# Patient Record
Sex: Male | Born: 1950 | Race: Black or African American | Hispanic: No | Marital: Married | Smoking: Never smoker
Health system: Southern US, Community
[De-identification: ages and names within clinical notes are randomized; demographics above are authoritative.]

## PROBLEM LIST (undated history)

## (undated) DIAGNOSIS — Z9889 Other specified postprocedural states: Secondary | ICD-10-CM

## (undated) DIAGNOSIS — Z9289 Personal history of other medical treatment: Secondary | ICD-10-CM

## (undated) DIAGNOSIS — I10 Essential (primary) hypertension: Secondary | ICD-10-CM

## (undated) DIAGNOSIS — I422 Other hypertrophic cardiomyopathy: Secondary | ICD-10-CM

## (undated) HISTORY — DX: Other hypertrophic cardiomyopathy: I42.2

## (undated) HISTORY — DX: Personal history of other medical treatment: Z92.89

## (undated) HISTORY — PX: CORONARY ANGIOPLASTY: SHX604

## (undated) HISTORY — DX: Other specified postprocedural states: Z98.890

---

## 2001-09-04 HISTORY — PX: LIPOMA EXCISION: SHX5283

## 2003-12-18 ENCOUNTER — Emergency Department (HOSPITAL_COMMUNITY): Admission: EM | Admit: 2003-12-18 | Discharge: 2003-12-18 | Payer: Self-pay | Admitting: Emergency Medicine

## 2004-08-23 ENCOUNTER — Ambulatory Visit (HOSPITAL_BASED_OUTPATIENT_CLINIC_OR_DEPARTMENT_OTHER): Admission: RE | Admit: 2004-08-23 | Discharge: 2004-08-23 | Payer: Self-pay | Admitting: *Deleted

## 2011-11-29 ENCOUNTER — Ambulatory Visit: Payer: Self-pay | Admitting: Family Medicine

## 2011-11-29 ENCOUNTER — Ambulatory Visit: Payer: Self-pay

## 2011-11-29 VITALS — BP 182/108 | HR 71 | Temp 98.5°F | Resp 16 | Ht 66.0 in | Wt 166.6 lb

## 2011-11-29 DIAGNOSIS — M545 Low back pain, unspecified: Secondary | ICD-10-CM

## 2011-11-29 LAB — POCT URINALYSIS DIPSTICK
Bilirubin, UA: NEGATIVE
Blood, UA: NEGATIVE
Glucose, UA: NEGATIVE
Leukocytes, UA: NEGATIVE
Nitrite, UA: NEGATIVE
Protein, UA: 100
Spec Grav, UA: 1.02
Urobilinogen, UA: 1
pH, UA: 6.5

## 2011-11-29 LAB — POCT UA - MICROSCOPIC ONLY
Casts, Ur, LPF, POC: NEGATIVE
Crystals, Ur, HPF, POC: NEGATIVE
Mucus, UA: POSITIVE
Yeast, UA: NEGATIVE

## 2011-11-29 MED ORDER — METHYLPREDNISOLONE 4 MG PO TABS: ORAL_TABLET | ORAL | Status: DC

## 2011-11-29 NOTE — Progress Notes (Signed)
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  No h/o back problems.  No fever or bowel or bladder problems.  Legs are strong without numbness or radicular sx.  Pain subsides when lying down.  O:  NAD SLR  Normal No muscle asymmetry. Reflexes intact. Moving slowly but without antalgic gain Nontender lumbar area [facility] reading (PRIMARY) by  Dr. Milus Glazier lumbar AP/Lat view:  Neg U/A:  neg.   A:  Muscular strain  P:  Medrol 4 mg ii daily x 5

## 2011-11-30 ENCOUNTER — Encounter: Payer: Self-pay | Admitting: Family Medicine

## 2011-11-30 ENCOUNTER — Telehealth: Payer: Self-pay

## 2011-11-30 NOTE — Telephone Encounter (Signed)
Asked Darl Pikes for more details about what pt needed. She reported that the pt came in and got the work note he needed for today. No need to CB

## 2011-11-30 NOTE — Telephone Encounter (Signed)
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

## 2011-12-08 ENCOUNTER — Other Ambulatory Visit: Payer: Self-pay

## 2011-12-08 ENCOUNTER — Emergency Department (HOSPITAL_COMMUNITY): Payer: Self-pay

## 2011-12-08 ENCOUNTER — Inpatient Hospital Stay (HOSPITAL_COMMUNITY)
Admission: EM | Admit: 2011-12-08 | Discharge: 2011-12-10 | DRG: 287 | Disposition: A | Discharge status: Home / Self Care | Payer: Self-pay | Attending: Interventional Cardiology | Admitting: Interventional Cardiology

## 2011-12-08 ENCOUNTER — Encounter: Payer: Self-pay | Admitting: Physician Assistant

## 2011-12-08 ENCOUNTER — Inpatient Hospital Stay (HOSPITAL_COMMUNITY): Payer: Self-pay

## 2011-12-08 ENCOUNTER — Encounter (HOSPITAL_COMMUNITY)
Admission: EM | Disposition: A | Discharge status: Home / Self Care | Payer: Self-pay | Source: Home / Self Care | Attending: Interventional Cardiology

## 2011-12-08 ENCOUNTER — Encounter (HOSPITAL_COMMUNITY): Payer: Self-pay | Admitting: *Deleted

## 2011-12-08 ENCOUNTER — Ambulatory Visit (INDEPENDENT_AMBULATORY_CARE_PROVIDER_SITE_OTHER): Payer: Self-pay | Admitting: Family Medicine

## 2011-12-08 VITALS — BP 185/107 | HR 61 | Temp 98.0°F | Resp 16 | Ht 65.5 in | Wt 180.6 lb

## 2011-12-08 DIAGNOSIS — I10 Essential (primary) hypertension: Secondary | ICD-10-CM | POA: Diagnosis present

## 2011-12-08 DIAGNOSIS — R9431 Abnormal electrocardiogram [ECG] [EKG]: Secondary | ICD-10-CM

## 2011-12-08 DIAGNOSIS — I517 Cardiomegaly: Principal | ICD-10-CM | POA: Diagnosis present

## 2011-12-08 HISTORY — DX: Essential (primary) hypertension: I10

## 2011-12-08 HISTORY — PX: LEFT HEART CATHETERIZATION WITH CORONARY ANGIOGRAM: SHX5451

## 2011-12-08 LAB — COMPREHENSIVE METABOLIC PANEL
ALT: 19 U/L (ref 0–53)
Alkaline Phosphatase: 72 U/L (ref 39–117)
Alkaline Phosphatase: 72 U/L (ref 39–117)
BUN: 11 mg/dL (ref 6–23)
CO2: 27 mEq/L (ref 19–32)
Creatinine, Ser: 0.81 mg/dL (ref 0.50–1.35)
Glucose, Bld: 129 mg/dL — ABNORMAL HIGH (ref 70–99)
Potassium: 4.3 mEq/L (ref 3.5–5.3)
Sodium: 140 mEq/L (ref 135–145)
Total Bilirubin: 0.5 mg/dL (ref 0.3–1.2)
Total Protein: 7.1 g/dL (ref 6.0–8.3)

## 2011-12-08 LAB — APTT: aPTT: 33 seconds (ref 24–37)

## 2011-12-08 LAB — LIPID PANEL
Cholesterol: 178 mg/dL (ref 0–200)
HDL: 50 mg/dL (ref >39)
HDL: 47 mg/dL (ref >39)
LDL Cholesterol: 119 mg/dL — ABNORMAL HIGH (ref 0–99)
Total CHOL/HDL Ratio: 3.6 Ratio
Triglycerides: 45 mg/dL (ref <150)
Triglycerides: 43 mg/dL (ref <150)
VLDL: 9 mg/dL (ref 0–40)

## 2011-12-08 LAB — CBC
MCH: 30.4 pg (ref 26.0–34.0)
MCV: 85.1 fL (ref 78.0–100.0)
WBC: 9.7 10*3/uL (ref 4.0–10.5)

## 2011-12-08 LAB — PROTIME-INR
INR: 1.08 (ref 0.00–1.49)
Prothrombin Time: 15.1 seconds (ref 11.6–15.2)

## 2011-12-08 LAB — HEMOGLOBIN A1C
Hgb A1c MFr Bld: 7 % — ABNORMAL HIGH (ref <5.7)
Mean Plasma Glucose: 154 mg/dL — ABNORMAL HIGH (ref <117)

## 2011-12-08 LAB — MRSA PCR SCREENING: MRSA by PCR: NEGATIVE

## 2011-12-08 SURGERY — LEFT HEART CATHETERIZATION WITH CORONARY ANGIOGRAM
Anesthesia: LOCAL

## 2011-12-08 MED ORDER — LIDOCAINE HCL (PF) 1 % IJ SOLN
INTRAMUSCULAR | Status: AC
  Filled 2011-12-08: qty 30

## 2011-12-08 MED ORDER — NITROGLYCERIN 0.4 MG SL SUBL: 0.4000 mg | SUBLINGUAL_TABLET | SUBLINGUAL | Status: DC | PRN

## 2011-12-08 MED ORDER — MORPHINE SULFATE 2 MG/ML IJ SOLN: 1.0000 mg | INTRAMUSCULAR | Status: DC | PRN

## 2011-12-08 MED ORDER — ASPIRIN 81 MG PO CHEW
324.0000 mg | CHEWABLE_TABLET | Freq: Once | ORAL | Status: AC
  Administered 2011-12-08: 324 mg via ORAL

## 2011-12-08 MED ORDER — ACETAMINOPHEN 325 MG PO TABS
650.0000 mg | ORAL_TABLET | ORAL | Status: DC | PRN
  Administered 2011-12-08 – 2011-12-09 (×2): 325 mg via ORAL
  Filled 2011-12-08: qty 1
  Filled 2011-12-08: qty 2

## 2011-12-08 MED ORDER — ASPIRIN 81 MG PO CHEW
324.0000 mg | CHEWABLE_TABLET | Freq: Once | ORAL | Status: DC
  Filled 2011-12-08: qty 1

## 2011-12-08 MED ORDER — MIDAZOLAM HCL 2 MG/2ML IJ SOLN
INTRAMUSCULAR | Status: AC
  Filled 2011-12-08: qty 2

## 2011-12-08 MED ORDER — FENTANYL CITRATE 0.05 MG/ML IJ SOLN
INTRAMUSCULAR | Status: AC
  Filled 2011-12-08: qty 2

## 2011-12-08 MED ORDER — SODIUM CHLORIDE 0.9 % IV SOLN: 1.0000 mL/kg/h | INTRAVENOUS | Status: AC

## 2011-12-08 MED ORDER — NITROGLYCERIN 0.2 MG/ML ON CALL CATH LAB
INTRAVENOUS | Status: AC
  Filled 2011-12-08: qty 1

## 2011-12-08 MED ORDER — HEPARIN BOLUS VIA INFUSION: 4000.0000 [IU] | Freq: Once | INTRAVENOUS | Status: DC

## 2011-12-08 MED ORDER — NITROGLYCERIN IN D5W 200-5 MCG/ML-% IV SOLN
INTRAVENOUS | Status: AC
  Filled 2011-12-08: qty 250

## 2011-12-08 MED ORDER — HEPARIN (PORCINE) IN NACL 100-0.45 UNIT/ML-% IJ SOLN: 1000.0000 [IU]/h | INTRAMUSCULAR | Status: DC

## 2011-12-08 MED ORDER — ASPIRIN 81 MG PO CHEW
81.0000 mg | CHEWABLE_TABLET | Freq: Every day | ORAL | Status: DC
  Administered 2011-12-09 – 2011-12-10 (×2): 81 mg via ORAL
  Filled 2011-12-08: qty 1

## 2011-12-08 MED ORDER — LISINOPRIL 10 MG PO TABS
10.0000 mg | ORAL_TABLET | Freq: Every day | ORAL | Status: DC
  Administered 2011-12-08 – 2011-12-09 (×2): 10 mg via ORAL
  Filled 2011-12-08 (×2): qty 1

## 2011-12-08 MED ORDER — ONDANSETRON HCL 4 MG/2ML IJ SOLN: 4.0000 mg | Freq: Four times a day (QID) | INTRAMUSCULAR | Status: DC | PRN

## 2011-12-08 MED ORDER — HEPARIN (PORCINE) IN NACL 2-0.9 UNIT/ML-% IJ SOLN
INTRAMUSCULAR | Status: AC
  Filled 2011-12-08: qty 2000

## 2011-12-08 MED ORDER — SODIUM CHLORIDE 0.9 % IV SOLN: 1000.0000 mL | INTRAVENOUS | Status: DC

## 2011-12-08 MED ORDER — HEPARIN SODIUM (PORCINE) 5000 UNIT/ML IJ SOLN
INTRAMUSCULAR | Status: AC
  Filled 2011-12-08: qty 1

## 2011-12-08 MED ORDER — SODIUM CHLORIDE 0.9 % IV SOLN: INTRAVENOUS | Status: DC

## 2011-12-08 MED ORDER — NITROGLYCERIN IN D5W 200-5 MCG/ML-% IV SOLN: 5.0000 ug/min | INTRAVENOUS | Status: DC

## 2011-12-08 MED ORDER — ASPIRIN 81 MG PO CHEW: 324.0000 mg | CHEWABLE_TABLET | Freq: Once | ORAL | Status: DC

## 2011-12-08 NOTE — ED Notes (Signed)
Pt. Undress.in a gown On monitor

## 2011-12-08 NOTE — Progress Notes (Signed)
Chaplain Note:  Chaplain responded to code STEMI page received at 09:28.  Chaplain provided spiritual comfort, support, and prayer for pt and pt's family.  Chaplain supported staff by serving as liaison between [employer] and family.  Pt's family expressed appreciation for chaplain support.  Chaplain will follow up if needed.   12/08/11 1100  Clinical Encounter Type  Visited With Patient;Family;Health care provider  Visit Type Spiritual support;ED;Other (Comment) (Code STEMI)  Referral From Other (Comment) (Code pager)  Spiritual Encounters  Spiritual Needs Emotional;Prayer  Stress Factors  Patient Stress Factors Major life changes;Health changes  Family Stress Factors Major life changes    Verdie Shire, chaplain resident [phone]

## 2011-12-08 NOTE — ED Notes (Signed)
MD at bedside. 

## 2011-12-08 NOTE — Progress Notes (Signed)
  Subjective:    Patient ID: Sean Pittman, male    DOB: Oct 03, 1950, 61 y.o.   MRN: [id number]  HPI  Pt presents to clinic for recheck BP.  He was seen last week for back pain which has significantly improved but at that visit he was told his BP was high.  He has been monitoring it all week and has had readings in 200's/100's.  He feels fine, no CP, no nausea or vision changes.  Nonsmoker.  He has had problems with his BP being high in the past but got it controlled with diet but his diet has returned to poor.  He gets heartburn only after his wife's meatloaf.  He is here today because he thinks he needs to start medications for control.  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  Review of Systems  Eyes: Negative for visual disturbance.  Respiratory: Negative for shortness of breath.   Cardiovascular: Negative for chest pain.  Gastrointestinal: Negative for nausea.       Objective:   Physical Exam  Constitutional: He is oriented to person, place, and time. He appears well-developed and well-nourished.  HENT:  Head: Normocephalic and atraumatic.  Right Ear: External ear normal.  Left Ear: External ear normal.  Nose: Nose normal.  Eyes: Conjunctivae are normal. Pupils are equal, round, and reactive to light.  Neck: Neck supple.  Cardiovascular: Normal rate, regular rhythm and normal heart sounds.   No murmur heard. Pulmonary/Chest: Effort normal and breath sounds normal.  Neurological: He is alert and oriented to person, place, and time.  Skin: Skin is warm and dry.  Psychiatric: He has a normal mood and affect. His behavior is normal. Judgment and thought content normal.      EKG - T wave inversion in anteriolateral leads, some ST elevation in V2, V3. - see scanned version.    Assessment & Plan:   1. HTN (hypertension)  Comprehensive metabolic panel, Lipid panel, EKG 12-Lead, aspirin chewable tablet 324 mg  2. Abnormal EKG     Due to significantly elevated BP and abnl  EKG pt to [facility] for evaluation.  Pt started on O2, given ASA and IV started.    Pt to [facility] by EMS.  Pt to f/u for continued HTN treatment.  D/w Dr. Milus Glazier.

## 2011-12-08 NOTE — H&P (Signed)
Admit date: 12/08/2011 Referring Physician Dr. Ignacia Palma Primary CardiologistVaranasi Chief complaint/reason for admission: ECG changes, back pain  HPI: 61 y/o who does not see a doctor regularly.  He went to urgent care for back pain.  He had an ECG done there and this was abnormal, so he was sent to [facility] for further evaluation.  His back pain is gone.  No chest pain , but he does have anterior ST elevation with lateral ST depression and T wave inversion.  Code STEMI was called.    PMH:   HTN in the past  PSH:  Lipoma  ALLERGIES:   Penicillins  Prior to Admit Meds:   Prescriptions prior to admission  Medication Sig Dispense Refill  . ibuprofen (ADVIL,MOTRIN) 200 MG tablet Take 200 mg by mouth every 6 (six) hours as needed. For allergies       Family HX:    Family History  Problem Relation Age of Onset  . Hypertension Mother   . Diabetes Mother   . Hyperlipidemia Mother   . Kidney disease Mother   . Cancer Father   . Hypertension Sister    Social HX:    History   Social History  . Marital Status: Married    Spouse Name: N/A    Number of Children: N/A  . Years of Education: N/A   Occupational History  . Not on file.   Social History Main Topics  . Smoking status: Never Smoker   . Smokeless tobacco: Not on file  . Alcohol Use: No  . Drug Use: No  . Sexually Active: Not on file   Other Topics Concern  . Not on file   Social History Narrative  . No narrative on file     ROS:  All 11 ROS were addressed and are negative except what is stated in the HPI  PHYSICAL EXAM Filed Vitals:   12/08/11 0918  BP: 178/100  Pulse: 58  Temp: 97.7 F (36.5 C)  Resp: 16   General: Well developed, well nourished, in no acute distress Head: Eyes PERRLA, No xanthomas.   Normal cephalic and atramatic  Lungs:   Clear bilaterally to auscultation and percussion. Heart:   HRRR S1 S2 Pulses are 2+ & equal.            No carotid bruit. No JVD.  Abdomen: abdomen soft and  non-tender Msk:  Back normal, normal gait. Normal strength and tone for age. Extremities:   No clubbing, cyanosis or edema. Neuro: Alert and oriented X 3. Psych:  Good affect, responds appropriately   Labs:   Lab Results  Component Value Date   WBC 9.7 12/08/2011   HGB 15.1 12/08/2011   HCT 42.2 12/08/2011   MCV 85.1 12/08/2011   PLT 209 12/08/2011   No results found for this basename: NA,K,CL,CO2,BUN,CREATININE,CALCIUM,LABALBU,PROT,BILITOT,ALKPHOS,ALT,AST,GLUCOSE in the last 168 hours No results found for this basename: CKTOTAL, CKMB, CKMBINDEX, TROPONINI   No results found for this basename: PTT   No results found for this basename: INR, PROTIME     No results found for this basename: CHOL   No results found for this basename: HDL   No results found for this basename: LDLCALC   No results found for this basename: TRIG   No results found for this basename: CHOLHDL   No results found for this basename: LDLDIRECT      Radiology:  @RISRSLT24 @  EKG:  NSR, anterior ST elevaton; lateral T wave inversion  ASSESSMENT: Abnormal ECG, possible anterior  MI  PLAN:  ECG is clearly abnormal.  Back pain better.  I suspect this is from LVH, particularly in the apex.  Discussed cath with patient.  WIll proceed with emergent cath. Will need BP meds.  Further plan depend on cath results.  Corky Crafts., MD  12/08/2011  10:20 AM

## 2011-12-08 NOTE — ED Notes (Signed)
Per EMS pt from Urgent Care with c/o EKG changes. Pt went to UC to have BP checked, noticed to hypertensive, performed EKG and noticed ST elevation. Not enough to call Code Stemi. Pt denies chest pain, shortness of breath, nausea/vomiting, diaphoresis or lightheadedness. Pt alert and oriented. VS BP 186/104 HR 60. Pt received baby aspirin x 4. IV 20G LAC.

## 2011-12-08 NOTE — ED Provider Notes (Signed)
History     CSN: [id number]  Arrival date & time 12/08/11  0915   None     Chief Complaint  Patient presents with  . Abnormal ECG    (Consider location/radiation/quality/duration/timing/severity/associated sxs/prior treatment) HPI Comments: Pt is a 61 year old man who had hurt his back last week. He had been seen at an urgent medical center and given medicine.  He had his blood pressure checked at church yesterday and it was high, so he went back to the urgent care center, had an EKG that showed ST elevation in the precordial leads, and was sent to [facility] ED for evaluation by EMS.  He denies chest pain, shortness of breath, sweating.  Patient is a 61 y.o. male presenting with chest pain. The history is provided by the patient and medical records ([facility]). No language interpreter was used.  Chest Pain     History reviewed. No pertinent past medical history.  History reviewed. No pertinent past surgical history.  Family History  Problem Relation Age of Onset  . Hypertension Mother   . Diabetes Mother   . Hyperlipidemia Mother   . Kidney disease Mother   . Cancer Father   . Hypertension Sister     History  Substance Use Topics  . Smoking status: Never Smoker   . Smokeless tobacco: Not on file  . Alcohol Use: No      Review of Systems  Constitutional: Negative.   HENT: Negative.   Eyes: Negative.   Respiratory: Negative.   Cardiovascular: Negative for chest pain.       Abnormal EKG noted at urgent care center.  Gastrointestinal: Negative.   Genitourinary: Negative.   Musculoskeletal: Positive for back pain.  Skin: Negative.   Neurological: Negative.   Psychiatric/Behavioral: Negative.     Allergies  Penicillins  Home Medications   Current Outpatient Rx  Name Route Sig Dispense Refill  . IBUPROFEN 200 MG PO TABS Oral Take 200 mg by mouth every 6 (six) hours as needed. For allergies      BP 178/100  Pulse 58  Temp 97.7 F (36.5  C)  Resp 16  Ht 5\' 6"  (1.676 m)  Wt 175 lb (79.379 kg)  BMI 28.25 kg/m2  SpO2 100%  Physical Exam  Nursing note and vitals reviewed. Constitutional: He is oriented to person, place, and time.       Hypertensive, no distress at rest.  HENT:  Head: Normocephalic and atraumatic.  Right Ear: External ear normal.  Left Ear: External ear normal.  Mouth/Throat: Oropharynx is clear and moist.  Eyes: Conjunctivae and EOM are normal. Pupils are equal, round, and reactive to light.  Neck: Normal range of motion. Neck supple.  Cardiovascular: Normal rate, regular rhythm and normal heart sounds.   Pulmonary/Chest: Effort normal and breath sounds normal.  Abdominal: Soft. Bowel sounds are normal.  Musculoskeletal: Normal range of motion. He exhibits no edema and no tenderness.  Neurological: He is alert and oriented to person, place, and time.       No sensory or motor deficits.  Skin: Skin is warm and dry.  Psychiatric: He has a normal mood and affect. His behavior is normal.    ED Course  Procedures (including critical care time)   Labs Reviewed  CBC  COMPREHENSIVE METABOLIC PANEL  PROTIME-INR  APTT  URINALYSIS, ROUTINE W REFLEX MICROSCOPIC   9:41 AM  Date: 12/08/2011  Rate:60  Rhythm: normal sinus rhythm  QRS Axis: normal  Intervals:  normal  ST/T Wave abnormalities: ST elevations anteriorly, inverted T waves in I, II, AVL, and V3-V6.  Conduction Disutrbances:none  Narrative Interpretation: Acute anterior myocardial infarction.  Old EKG Reviewed: none available  CODE STEMI called, Dr. Eldridge Dace responded and took pt to the cath lab.     1. Acute myocardial infarction           Carleene Cooper III, MD 12/08/11 [phone]

## 2011-12-08 NOTE — CV Procedure (Signed)
PROCEDURE:  Left heart catheterization with selective coronary angiography, left ventriculogram.  INDICATIONS:  Anterior STEMI  The risks, benefits, and details of the procedure were explained to the patient.  The patient verbalized understanding and wanted to proceed.  Informed written consent was obtained.  PROCEDURE TECHNIQUE:  After Xylocaine anesthesia a 93F sheath was placed in the right femoral artery with a single anterior needle wall stick.   Left coronary angiography was done using a Judkins L4 guide catheter.  Right coronary angiography was done using a Judkins R4 guide catheter.  Left ventriculography was done using a pigtail catheter.    CONTRAST:  Total of 80 cc.  COMPLICATIONS:  None.    HEMODYNAMICS:  Aortic pressure was 167/102; LV pressure was 170/21; LVEDP 30.  There was no gradient between the left ventricle and aorta.    ANGIOGRAPHIC DATA:   The left main coronary artery is widely patent.  The left anterior descending artery is a large vessel that reaches the apex.  There is minimal atherosclerosis.  Two diagonals which are patent.  The left circumflex artery is a large vessel.  OM1 is large and angiographically normal.  The right coronary artery is a large. PDA and PLA are medium sized and patent.  LEFT VENTRICULOGRAM:  Left ventricular angiogram was done in the 30 RAO projection and revealed normal left ventricular wall motion and systolic function with an estimated ejection fraction of 60%.  LVEDP was 30 mmHg.  Evidence of apical hypertrophy.  IMPRESSIONS:  1. Normal left main coronary artery. 2. No significant atheropsclerosis in the left anterior descending artery and its branches. 3. Normal left circumflex artery and its branches. 4. Normal right coronary artery. 5. Normal left ventricular systolic function.  LVEDP 30 mmHg.  Ejection fraction 60%.  RECOMMENDATION:  Apical hypertrophy is cause of ECG changes.  Needs aggressive BP control.  Will initiate  ACE-I.

## 2011-12-09 LAB — URINALYSIS, ROUTINE W REFLEX MICROSCOPIC
Leukocytes, UA: NEGATIVE
Protein, ur: NEGATIVE mg/dL
Specific Gravity, Urine: 1.017 (ref 1.005–1.030)
Urobilinogen, UA: 1 mg/dL (ref 0.0–1.0)
pH: 5.5 (ref 5.0–8.0)

## 2011-12-09 LAB — CARDIAC PANEL(CRET KIN+CKTOT+MB+TROPI)
CK, MB: 1.9 ng/mL (ref 0.3–4.0)
Relative Index: 1.5 (ref 0.0–2.5)
Total CK: 123 U/L (ref 7–232)
Troponin I: <0.3 ng/mL (ref <0.30)

## 2011-12-09 LAB — BASIC METABOLIC PANEL
CO2: 25 mEq/L (ref 19–32)
Chloride: 104 mEq/L (ref 96–112)
Creatinine, Ser: 1.04 mg/dL (ref 0.50–1.35)
Potassium: 4.4 mEq/L (ref 3.5–5.1)

## 2011-12-09 MED ORDER — LISINOPRIL 10 MG PO TABS
10.0000 mg | ORAL_TABLET | Freq: Once | ORAL | Status: AC
  Administered 2011-12-09: 10 mg via ORAL
  Filled 2011-12-09: qty 1

## 2011-12-09 MED ORDER — LISINOPRIL 10 MG PO TABS: 10.0000 mg | ORAL_TABLET | Freq: Every day | ORAL | Status: DC

## 2011-12-09 MED ORDER — ASPIRIN 81 MG PO CHEW: 81.0000 mg | CHEWABLE_TABLET | Freq: Every day | ORAL | Status: AC

## 2011-12-09 MED ORDER — SODIUM CHLORIDE 0.9 % IJ SOLN
3.0000 mL | Freq: Two times a day (BID) | INTRAMUSCULAR | Status: DC
  Administered 2011-12-10: 3 mL via INTRAVENOUS

## 2011-12-09 MED ORDER — LISINOPRIL 20 MG PO TABS
20.0000 mg | ORAL_TABLET | Freq: Every day | ORAL | Status: DC
  Administered 2011-12-10: 20 mg via ORAL
  Filled 2011-12-09: qty 1

## 2011-12-09 NOTE — Discharge Summary (Addendum)
Patient ID: Sean Pittman MRN: [id number] DOB/AGE: Dec 25, 1950 61 y.o.  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  He went to emergent cath and was found to have normal coronary arteries with normal LVF and apical hypertrophy which was felt to be the cause of his abnormal EKG.  He did well post-op and Lisinopril was added for BP control.  His BP was improved post-cath but still elevated and Lisinopril was increased to 20mg  daily and Coreg 3.125mg  BID added with improvement in BP.  He was discharge to home in stable condition.   Discharge Exam: Blood pressure 178/88, pulse 61, temperature 98.5 F (36.9 C), temperature source Oral, resp. rate 16, height 5\' 6"  (1.676 m), weight 80.7 kg (177 lb 14.6 oz), SpO2 97.00%.   WD WN BM in NAD Resp: clear to auscultation bilaterally Cor:  RRR no M/R/G Abd:  Soft, NT, ND, active BS Ext:  No cyanosis, erythema or edema, +2 pulses distally Labs:   Lab Results  Component Value Date   WBC 9.7 12/08/2011   HGB 15.1 12/08/2011   HCT 42.2 12/08/2011   MCV 85.1 12/08/2011   PLT 209 12/08/2011    Lab 12/09/11 0500 12/08/11 0932  NA 137 --  K 4.4 --  CL 104 --  CO2 25 --  BUN 14 --  CREATININE 1.04 --  CALCIUM 8.7 --  PROT -- 7.1  BILITOT -- 0.5  ALKPHOS -- 72  ALT -- 19  AST -- 33  GLUCOSE 116* --   Lab Results  Component Value Date   CKTOTAL 123 12/09/2011   CKMB 1.9 12/09/2011   TROPONINI <0.30  12/09/2011    Lab Results  Component Value Date   CHOL 151 12/08/2011   CHOL 178 12/08/2011   Lab Results  Component Value Date   HDL 47 12/08/2011   HDL 50 12/08/2011   Lab Results  Component Value Date   LDLCALC 95 12/08/2011   LDLCALC 119* 12/08/2011   Lab Results  Component Value Date   TRIG 43 12/08/2011   TRIG 45 12/08/2011   Lab Results  Component Value Date   CHOLHDL 3.2 12/08/2011   CHOLHDL 3.6 12/08/2011       Radiology: none  EKG:  NSR with LVH and ST depression with T wave inversions inferolaterally  FOLLOW UP PLANS AND APPOINTMENTS  Medication List  As of 12/09/2011 11:46 AM   ASK your doctor about these medications         ibuprofen 200 MG tablet   Commonly known as: ADVIL,MOTRIN   Take 200 mg by mouth every 6 (six) hours as needed. For allergies                    Aspirin 81mg  tablet             Take 1 tablet daily by mouth               Lisinopril  20mg  tablet              Take 1 tablet daily by mouth                Carvedilol 3.125mg  tablet             Take 1 tablet twice daily by mouth   Call Monday for an appointment to see Dr. Eldridge Dace on 4/9 for followup of blood pressure - [phone]  BRING ALL MEDICATIONS WITH YOU TO FOLLOW UP APPOINTMENTS  Time spent with patient to include physician time:35 minutes Signed: Quintella Reichert

## 2011-12-09 NOTE — Plan of Care (Signed)
Problem: Phase II Progression Outcomes Goal: Wean IV nitroglycerin to PO or topical Outcome: Completed/Met Date Met:  12/09/11 Nitroglycerin drip weaned off

## 2011-12-10 MED ORDER — CARVEDILOL 3.125 MG PO TABS
3.1250 mg | ORAL_TABLET | Freq: Two times a day (BID) | ORAL | Status: DC
  Administered 2011-12-10 (×2): 3.125 mg via ORAL
  Filled 2011-12-10 (×4): qty 1

## 2011-12-10 MED ORDER — CARVEDILOL 3.125 MG PO TABS: 3.1250 mg | ORAL_TABLET | Freq: Two times a day (BID) | ORAL | Status: DC

## 2011-12-10 MED ORDER — LISINOPRIL 20 MG PO TABS: 20.0000 mg | ORAL_TABLET | Freq: Every day | ORAL | Status: DC

## 2011-12-10 NOTE — Progress Notes (Signed)
SUBJECTIVE:  BP still not adequately controlled  OBJECTIVE:   Vitals:   Filed Vitals:  I&O's:   TELEMETRY: Reviewed telemetry pt in NSR     PHYSICAL EXAM General: Well developed, well nourished, in no acute distress Head: Eyes PERRLA, No xanthomas.   Normal cephalic and atramatic  Lungs:   Clear bilaterally to auscultation and percussion. Heart:   HRRR S1 S2 Pulses are 2+ & equal. Abdomen: Bowel sounds are positive, abdomen soft and non-tender without masses  Extremities:   No clubbing, cyanosis or edema.  DP +1 Neuro: Alert and oriented X 3. Psych:  Good affect, responds appropriately   LABS: Basic Metabolic Panel:  Basename 12/09/11 0500 12/08/11 0932  NA 137 137  K 4.4 5.2*  CL 104 103  CO2 25 24  GLUCOSE 116* 109*  BUN 14 11  CREATININE 1.04 0.81  CALCIUM 8.7 8.6  MG -- --  PHOS -- --   Liver Function Tests:  Basename 12/08/11 0932 12/08/11 0824  AST 33 15  ALT 19 17  ALKPHOS 72 72  BILITOT 0.5 0.7  PROT 7.1 7.1  ALBUMIN 3.5 4.3   No results found for this basename: LIPASE:2,AMYLASE:2 in the last 72 hours CBC:  Basename 12/08/11 0932  WBC 9.7  NEUTROABS --  HGB 15.1  HCT 42.2  MCV 85.1  PLT 209   Cardiac Enzymes:  Basename 12/09/11 0500 12/08/11 1000  CKTOTAL 123 173  CKMB 1.9 2.6  CKMBINDEX -- --  TROPONINI <0.30 <0.30   BNP: No components found with this basename: POCBNP:3 D-Dimer: No results found for this basename: DDIMER:2 in the last 72 hours Hemoglobin A1C:  Basename 12/08/11 1000  HGBA1C 7.0*   Fasting Lipid Panel:  Basename 12/08/11 1000  CHOL 151  HDL 47  LDLCALC 95  TRIG 43  CHOLHDL 3.2  LDLDIRECT --   Coag Panel:   Lab Results  Component Value Date   INR 1.08 12/08/2011   INR 1.17 12/08/2011    RADIOLOGY: Dg Lumbar Spine 2-3 Views  11/29/2011  *RADIOLOGY REPORT*  Clinical Data: 61 year old male with lumbar back pain.  LUMBAR SPINE - 2-3 VIEW  Comparison: None.  Findings: Normal lumbar segmentation. Bone  mineralization is within normal limits.  No spondylolisthesis.  Straightening of lordosis. Relatively preserved disc spaces.  Mild endplate spurring.  SI joints within normal limits.  IMPRESSION: No acute osseous abnormality in the lumbar spine.  Original Report Authenticated By: Ulla Potash III, M.D.      ASSESSMENT:  1.  Back pain the abnormal EKG 2.  Normal coronary arteries 3.  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 HTN elevated this am  PLAN:   1.  Will wait 1 hour after getting his am Lisinopril and ambulate in hall.  Recheck BP and if elevated then will increase Lisinopril to 20mg  and keep overnight.  Quintella Reichert, MD  12/10/2011  7:07 AM

## 2011-12-10 NOTE — Progress Notes (Signed)
Dr. Mayford Knife aware pt BP in 170s/90s this am.  Gave pt lisinopril prior to d/c as ordered.  Pt bp came down to 166/84.  D/c instructions given.  Pt to follow up with MD on Tuesday.  IV d/c'd  Will d/c home.

## 2013-09-08 ENCOUNTER — Telehealth: Payer: Self-pay | Admitting: *Deleted

## 2013-09-08 MED ORDER — CARVEDILOL 6.25 MG PO TABS: ORAL_TABLET | ORAL | Status: DC

## 2013-09-08 NOTE — Telephone Encounter (Signed)
Pt notified Rx sent in.

## 2013-09-08 NOTE — Telephone Encounter (Signed)
Patient needs refill on coreg to be sent to [facility] due to change in his insurance. He would like you to call him when you have sent it in. Thanks, MI

## 2013-10-14 ENCOUNTER — Telehealth: Payer: Self-pay | Admitting: Interventional Cardiology

## 2013-10-14 MED ORDER — LOSARTAN POTASSIUM 100 MG PO TABS: 100.0000 mg | ORAL_TABLET | Freq: Every day | ORAL | Status: DC

## 2013-10-14 MED ORDER — AMLODIPINE BESYLATE 10 MG PO TABS: 10.0000 mg | ORAL_TABLET | Freq: Every day | ORAL | Status: DC

## 2013-10-14 NOTE — Telephone Encounter (Signed)
Refills sent in. Pt aware.  

## 2013-10-14 NOTE — Telephone Encounter (Signed)
New message      refill--amlodipine 10mg and losartin 100mg .  Pls fax to [facility] [phone].  Pt want a 90day supply.  Pt has not been seen at new office.   pls call pt when it has been faxed.

## 2014-01-07 ENCOUNTER — Encounter: Payer: Self-pay | Admitting: Interventional Cardiology

## 2014-03-03 ENCOUNTER — Ambulatory Visit: Payer: Self-pay | Admitting: Interventional Cardiology

## 2014-03-16 ENCOUNTER — Encounter: Payer: Self-pay | Admitting: Interventional Cardiology

## 2014-03-16 ENCOUNTER — Ambulatory Visit (INDEPENDENT_AMBULATORY_CARE_PROVIDER_SITE_OTHER): Payer: BC Managed Care – PPO | Admitting: Interventional Cardiology

## 2014-03-16 VITALS — BP 171/102 | HR 65 | Ht 66.0 in | Wt 173.0 lb

## 2014-03-16 DIAGNOSIS — R9431 Abnormal electrocardiogram [ECG] [EKG]: Secondary | ICD-10-CM

## 2014-03-16 DIAGNOSIS — I10 Essential (primary) hypertension: Secondary | ICD-10-CM | POA: Insufficient documentation

## 2014-03-16 DIAGNOSIS — R9430 Abnormal result of cardiovascular function study, unspecified: Secondary | ICD-10-CM

## 2014-03-16 NOTE — Progress Notes (Signed)
Patient ID: Sean Pittman Deese, male   DOB: 03/12/1951, 63 y.o.   MRN: [id number]    [address] [place], [place]  [address] Phone: [phone] Fax:  [phone]  Date:  03/16/2014   ID:  Sean Pittman Bille, DOB 02/02/1951, MRN [id number]  PCP:  No primary provider on file.      History of Present Illness: Sean Pittman Sage is a 63 y.o. male who has apical hypertrophy. He has had HTN as well. BP is better controlled. 125-140/70-85 typically at home. No spikes to the 170 systolic since last visit. Hypertension:  Denies : Chest pain.  Dizziness.  Leg edema.  Palpitations.  Syncope.  less walking due to right leg pain. Leg pain has improved.    Wt Readings from Last 3 Encounters:  03/16/14 173 lb (78.472 kg)  12/09/11 177 lb 14.6 oz (80.7 kg)  12/08/11 180 lb 9.6 oz (81.92 kg)     Past Medical History  Diagnosis Date  . Hypertension   . Hyperlipidemia     Current Outpatient Prescriptions  Medication Sig Dispense Refill  . amLODipine (NORVASC) 10 MG tablet Take 1 tablet (10 mg total) by mouth daily.  90 tablet  1  . carvedilol (COREG) 6.25 MG tablet 1 tablet by mouth twice a day  180 tablet  1  . losartan (COZAAR) 100 MG tablet Take 1 tablet (100 mg total) by mouth daily.  90 tablet  1  . metFORMIN (GLUCOPHAGE) 500 MG tablet Take 500 mg by mouth 2 (two) times daily with a meal.        No current facility-administered medications for this visit.    Allergies:    Allergies  Allergen Reactions  . Penicillins     Childhood     Social History:  The patient  reports that he has never smoked. He does not have any smokeless tobacco history on file. He reports that he does not drink alcohol or use illicit drugs.   Family History:  The patient's family history includes Cancer in his father; Diabetes in his mother; Hyperlipidemia in his mother; Hypertension in his mother and sister; Kidney disease in his mother.   ROS:  Please see the history of present illness.  No  nausea, vomiting.  No fevers, chills.  No focal weakness.  No dysuria.   All other systems reviewed and negative. Eating less.  Weight loss.  PHYSICAL EXAM: VS:  BP 171/102  Pulse 65  Ht 5\' 6"  (1.676 m)  Wt 173 lb (78.472 kg)  BMI 27.94 kg/m2 Well nourished, well developed, in no acute distress HEENT: normal Neck: no JVD, no carotid bruits Cardiac:  normal S1, S2; RRR;  Lungs:  clear to auscultation bilaterally, no wheezing, rhonchi or rales Abd: soft, nontender, no hepatomegaly Ext: no edema Skin: warm and dry Neuro:   no focal abnormalities noted  EKG:  NSR, lateral TWI less pronounced from prior     ASSESSMENT AND PLAN:  Hypertension, essential  Continue Losartan Potassium Tablet, 100 MG, 1 tablet, Orally, Once a day, 30 day(s), 30, Refills 11 Continue Norvasc Tablet, 10 Milligram, TAKE 1 TABLET BY MOUTH EVERY DAY, 30 days, 30, Refills 11 Notes: BP better at home. COntinue use generic ARB for cost savings. Cutting salt out as well. Continue to check blood pressure at home.  Call us if systolic (top number) is typically above 140, or below 100. Recheck in the office is 148/76.   2. Nonspecific abnormal unspecified cardiovascular function study  Notes:  Apical hypertrophy noted by cath.    Signed, Fredric Mare, MD, [facility] 03/16/2014 8:24 AM

## 2014-03-16 NOTE — Patient Instructions (Signed)
Your physician recommends that you continue on your current medications as directed. Please refer to the Current Medication list given to you today.  Your physician wants you to follow-up in: 1 year with Dr. Varanasi. You will receive a reminder letter in the mail two months in advance. If you don't receive a letter, please call our office to schedule the follow-up appointment.  

## 2014-04-09 ENCOUNTER — Other Ambulatory Visit: Payer: Self-pay | Admitting: *Deleted

## 2014-04-09 MED ORDER — CARVEDILOL 6.25 MG PO TABS: ORAL_TABLET | ORAL | Status: DC

## 2014-05-08 ENCOUNTER — Other Ambulatory Visit: Payer: Self-pay | Admitting: *Deleted

## 2014-05-08 MED ORDER — AMLODIPINE BESYLATE 10 MG PO TABS: 10.0000 mg | ORAL_TABLET | Freq: Every day | ORAL | Status: DC

## 2014-05-08 MED ORDER — LOSARTAN POTASSIUM 100 MG PO TABS: 100.0000 mg | ORAL_TABLET | Freq: Every day | ORAL | Status: DC

## 2014-05-17 ENCOUNTER — Emergency Department (HOSPITAL_COMMUNITY)
Admission: EM | Admit: 2014-05-17 | Discharge: 2014-05-17 | Disposition: A | Discharge status: Home / Self Care | Payer: BC Managed Care – PPO | Source: Home / Self Care | Attending: Emergency Medicine | Admitting: Emergency Medicine

## 2014-05-17 ENCOUNTER — Encounter (HOSPITAL_COMMUNITY): Payer: Self-pay | Admitting: Emergency Medicine

## 2014-05-17 DIAGNOSIS — IMO0002 Reserved for concepts with insufficient information to code with codable children: Secondary | ICD-10-CM

## 2014-05-17 DIAGNOSIS — L03012 Cellulitis of left finger: Secondary | ICD-10-CM

## 2014-05-17 MED ORDER — LIDOCAINE HCL (PF) 2 % IJ SOLN
INTRAMUSCULAR | Status: AC
  Filled 2014-05-17: qty 2

## 2014-05-17 MED ORDER — BACITRACIN ZINC 500 UNIT/GM EX OINT
TOPICAL_OINTMENT | CUTANEOUS | Status: AC
  Filled 2014-05-17: qty 0.9

## 2014-05-17 MED ORDER — CLINDAMYCIN HCL 300 MG PO CAPS: 300.0000 mg | ORAL_CAPSULE | Freq: Three times a day (TID) | ORAL | Status: DC

## 2014-05-17 NOTE — ED Provider Notes (Signed)
Medical screening examination/treatment/procedure(s) were performed by non-physician practitioner and as supervising physician I was immediately available for consultation/collaboration.  Chieko Neises, M.D.  Eual Lindstrom C Keiandra Sullenger, MD 05/17/14 1354 

## 2014-05-17 NOTE — Discharge Instructions (Signed)
Fingertip Infection °When an infection is around the nail, it is called a paronychia. When it appears over the tip of the finger, it is called a felon. These infections are due to minor injuries or cracks in the skin. If they are not treated properly, they can lead to bone infection and permanent damage to the fingernail. °Incision and drainage is necessary if a pus pocket (an abscess) has formed. Antibiotics and pain medicine may also be needed. Keep your hand elevated for the next 2-3 days to reduce swelling and pain. If a pack was placed in the abscess, it should be removed in 1-2 days by your caregiver. Soak the finger in warm water for 20 minutes 4 times daily to help promote drainage. °Keep the hands as dry as possible. Wear protective gloves with cotton liners. See your caregiver for follow-up care as recommended.  °HOME CARE INSTRUCTIONS  °· Keep wound clean, dry and dressed as suggested by your caregiver. °· Soak in warm salt water for fifteen minutes, four times per day for bacterial infections. °· Your caregiver will prescribe an antibiotic if a bacterial infection is suspected. Take antibiotics as directed and finish the prescription, even if the problem appears to be improving before the medicine is gone. °· Only take over-the-counter or prescription medicines for pain, discomfort, or fever as directed by your caregiver. °SEEK IMMEDIATE MEDICAL CARE IF: °· There is redness, swelling, or increasing pain in the wound. °· Pus or any other unusual drainage is coming from the wound. °· An unexplained oral temperature above 102° F (38.9° C) develops. °· You notice a foul smell coming from the wound or dressing. °MAKE SURE YOU:  °· Understand these instructions. °· Monitor your condition. °· Contact your caregiver if you are getting worse or not improving. °Document Released: 09/28/2004 Document Revised: 11/13/2011 Document Reviewed: 09/24/2008 °ExitCare® Patient Information ©2015 [employer]. This  information is not intended to replace advice given to you by your health care provider. Make sure you discuss any questions you have with your health care provider. ° °

## 2014-05-17 NOTE — ED Notes (Signed)
C/O left thumb pain and swelling around nailbed x 2 wks with progressive worsening.

## 2014-05-17 NOTE — ED Provider Notes (Signed)
CSN: [id number]     Arrival date & time 05/17/14  1039 History   First MD Initiated Contact with Patient 05/17/14 1049     Chief Complaint  Patient presents with  . Hand Pain   (Consider location/radiation/quality/duration/timing/severity/associated sxs/prior Treatment) HPI 63 year old male with a history of hypertension and diabetes presents complaining of left thumb pain and swelling. This initially began as some irritation in his medial nail fold. Since then he has increasing pain, redness, and swelling. The pain is throughout the end of the thumb including under the nail and along the medial side of the thumb. He has not had any drainage. He did not bite his fingernails. He has never had this before. No systemic symptoms.  Past Medical History  Diagnosis Date  . Hypertension   . Hyperlipidemia   . Myocardial infarction    Past Surgical History  Procedure Laterality Date  . Lipoma excision  2003  . Coronary angioplasty     Family History  Problem Relation Age of Onset  . Hypertension Mother   . Diabetes Mother   . Hyperlipidemia Mother   . Kidney disease Mother   . Cancer Father   . Hypertension Sister    History  Substance Use Topics  . Smoking status: Never Smoker   . Smokeless tobacco: Not on file  . Alcohol Use: No    Review of Systems  Musculoskeletal:       See history of present illness  All other systems reviewed and are negative.   Allergies  Penicillins  Home Medications   Prior to Admission medications   Medication Sig Start Date End Date Taking? Authorizing Provider  amLODipine (NORVASC) 10 MG tablet Take 1 tablet (10 mg total) by mouth daily. 05/08/14  Yes Corky Crafts, MD  aspirin 81 MG tablet Take 81 mg by mouth daily.   Yes Historical Provider, MD  carvedilol (COREG) 6.25 MG tablet 1 tablet by mouth twice a day 04/09/14  Yes Corky Crafts, MD  losartan (COZAAR) 100 MG tablet Take 1 tablet (100 mg total) by mouth daily. 05/08/14  Yes  Corky Crafts, MD  metFORMIN (GLUCOPHAGE) 500 MG tablet Take 500 mg by mouth 2 (two) times daily with a meal.  02/14/14  Yes Historical Provider, MD  clindamycin (CLEOCIN) 300 MG capsule Take 1 capsule (300 mg total) by mouth 3 (three) times daily. 05/17/14   Adrian Blackwater Toussaint Golson, PA-C   BP 162/93  Pulse 92  Temp(Src) 98.5 F (36.9 C) (Oral)  Resp 16 Physical Exam  Nursing note and vitals reviewed. Constitutional: He is oriented to person, place, and time. He appears well-developed and well-nourished. No distress.  HENT:  Head: Normocephalic.  Pulmonary/Chest: Effort normal. No respiratory distress.  Musculoskeletal:       Hands: Neurological: He is alert and oriented to person, place, and time. Coordination normal.  Skin: Skin is warm and dry. No rash noted. He is not diaphoretic.  Psychiatric: He has a normal mood and affect. Judgment normal.    ED Course  Drain paronychia Date/Time: 05/17/2014 11:48 AM Performed by: Autumn Messing, H Authorized by: Reuben Likes Consent: Verbal consent obtained. Risks and benefits: risks, benefits and alternatives were discussed Consent given by: patient Patient identity confirmed: verbally with patient Preparation: Patient was prepped and draped in the usual sterile fashion. Local anesthesia used: yes Anesthesia: digital block Local anesthetic: lidocaine 2% without epinephrine Anesthetic total: 5 ml Patient sedated: no Patient tolerance: Patient tolerated the procedure well with  no immediate complications.   (including critical care time) Labs Review Labs Reviewed  CULTURE, ROUTINE-ABSCESS    Imaging Review No results found.   MDM   1. Paronychia, left    Paronychia, drained. There is minimal surrounding cellulitis, we'll treat with clindamycin. Followup if no improvement in 3-4 days.   Meds ordered this encounter  Medications  . aspirin 81 MG tablet    Sig: Take 81 mg by mouth daily.  . clindamycin (CLEOCIN) 300 MG  capsule    Sig: Take 1 capsule (300 mg total) by mouth 3 (three) times daily.    Dispense:  21 capsule    Refill:  0    Order Specific Question:  Supervising Provider    Answer:  Lorenz Coaster, DAVID C [[id number]]       Graylon Good, PA-C 05/17/14 1150

## 2014-05-20 NOTE — ED Notes (Signed)
Abscess culture thumb: Few strep Group G.  Pt. Treated with Cleocin.  Message sent to [employer] PA. Vassie Moselle 05/20/2014

## 2014-05-21 ENCOUNTER — Telehealth (HOSPITAL_COMMUNITY): Payer: Self-pay | Admitting: *Deleted

## 2014-05-21 ENCOUNTER — Encounter (HOSPITAL_COMMUNITY): Payer: Self-pay | Admitting: Emergency Medicine

## 2014-05-21 ENCOUNTER — Emergency Department (INDEPENDENT_AMBULATORY_CARE_PROVIDER_SITE_OTHER)
Admission: EM | Admit: 2014-05-21 | Discharge: 2014-05-21 | Disposition: A | Discharge status: Home / Self Care | Payer: BC Managed Care – PPO | Source: Home / Self Care | Attending: Family Medicine | Admitting: Family Medicine

## 2014-05-21 DIAGNOSIS — L03012 Cellulitis of left finger: Secondary | ICD-10-CM

## 2014-05-21 DIAGNOSIS — Z4800 Encounter for change or removal of nonsurgical wound dressing: Secondary | ICD-10-CM

## 2014-05-21 LAB — CULTURE, ROUTINE-ABSCESS

## 2014-05-21 MED ORDER — SULFAMETHOXAZOLE-TRIMETHOPRIM 800-160 MG PO TABS: 2.0000 | ORAL_TABLET | Freq: Two times a day (BID) | ORAL | Status: DC

## 2014-05-21 NOTE — Discharge Instructions (Signed)
Thank you for coming in today. Continue clindamycin Add bactrim twice daily for 1 week.  Come back if not getting better  Paronychia Paronychia is an inflammatory reaction involving the folds of the skin surrounding the fingernail. This is commonly caused by an infection in the skin around a nail. The most common cause of paronychia is frequent wetting of the hands (as seen with bartenders, food servers, nurses or others who wet their hands). This makes the skin around the fingernail susceptible to infection by bacteria (germs) or fungus. Other predisposing factors are:  Aggressive manicuring.  Nail biting.  Thumb sucking. The most common cause is a staphylococcal (a type of germ) infection, or a fungal (Candida) infection. When caused by a germ, it usually comes on suddenly with redness, swelling, pus and is often painful. It may get under the nail and form an abscess (collection of pus), or form an abscess around the nail. If the nail itself is infected with a fungus, the treatment is usually prolonged and may require oral medicine for up to one year. Your caregiver will determine the length of time treatment is required. The paronychia caused by bacteria (germs) may largely be avoided by not pulling on hangnails or picking at cuticles. When the infection occurs at the tips of the finger it is called felon. When the cause of paronychia is from the herpes simplex virus (HSV) it is called herpetic whitlow. TREATMENT  When an abscess is present treatment is often incision and drainage. This means that the abscess must be cut open so the pus can get out. When this is done, the following home care instructions should be followed. HOME CARE INSTRUCTIONS   It is important to keep the affected fingers very dry. Rubber or plastic gloves over cotton gloves should be used whenever the hand must be placed in water.  Keep wound clean, dry and dressed as suggested by your caregiver between warm soaks or warm  compresses.  Soak in warm water for fifteen to twenty minutes three to four times per day for bacterial infections. Fungal infections are very difficult to treat, so often require treatment for long periods of time.  For bacterial (germ) infections take antibiotics (medicine which kill germs) as directed and finish the prescription, even if the problem appears to be solved before the medicine is gone.  Only take over-the-counter or prescription medicines for pain, discomfort, or fever as directed by your caregiver. SEEK IMMEDIATE MEDICAL CARE IF:  You have redness, swelling, or increasing pain in the wound.  You notice pus coming from the wound.  You have a fever.  You notice a bad smell coming from the wound or dressing. Document Released: 02/14/2001 Document Revised: 11/13/2011 Document Reviewed: 10/16/2008 [facility] Patient Information 2015 [place], [place]. This information is not intended to replace advice given to you by your health care provider. Make sure you discuss any questions you have with your health care provider.

## 2014-05-21 NOTE — ED Notes (Signed)
Discussed with Dr. Lorenz Coaster and he said it should be OK. Sean Pittman 05/21/2014

## 2014-05-21 NOTE — ED Provider Notes (Signed)
Sean Pittman is a 63 y.o. male who presents to Urgent Care today for paronychia followup. Patient was seen on September 13 for paronychia of the left thumb. The abscess was drained and cultured. Patient was treated with clindamycin. He notes that he improved initially but has had returning redness and pain at the left thumb. No fevers or chills nausea vomiting or diarrhea. He continues to take clindamycin.   Past Medical History  Diagnosis Date  . Hypertension   . Hyperlipidemia   . Myocardial infarction    History  Substance Use Topics  . Smoking status: Never Smoker   . Smokeless tobacco: Not on file  . Alcohol Use: No   ROS as above Medications: No current facility-administered medications for this encounter.   Current Outpatient Prescriptions  Medication Sig Dispense Refill  . amLODipine (NORVASC) 10 MG tablet Take 1 tablet (10 mg total) by mouth daily.  90 tablet  2  . aspirin 81 MG tablet Take 81 mg by mouth daily.      . carvedilol (COREG) 6.25 MG tablet 1 tablet by mouth twice a day  180 tablet  2  . clindamycin (CLEOCIN) 300 MG capsule Take 1 capsule (300 mg total) by mouth 3 (three) times daily.  21 capsule  0  . losartan (COZAAR) 100 MG tablet Take 1 tablet (100 mg total) by mouth daily.  90 tablet  2  . metFORMIN (GLUCOPHAGE) 500 MG tablet Take 500 mg by mouth 2 (two) times daily with a meal.       . sulfamethoxazole-trimethoprim (SEPTRA DS) 800-160 MG per tablet Take 2 tablets by mouth 2 (two) times daily.  28 tablet  0    Exam:  BP 151/84  Pulse 78  Temp(Src) 98 F (36.7 C) (Oral)  Resp 16  SpO2 98% Gen: Well NAD Left thumb nail border. Erythema. No fluctuance palpated. Mildly tender. Capillary refill and sensation and motion are intact  Abscess Culture did not grow any bacteria  No results found for this or any previous visit (from the past 24 hour(s)). No results found.  Assessment and Plan: 63 y.o. male with paronychia. Unclear etiology. Augment  clindamycin with Bactrim. Followup if not improving  Discussed warning signs or symptoms. Please see discharge instructions. Patient expresses understanding.     Rodolph Bong, MD 05/21/14 1226

## 2014-05-21 NOTE — ED Notes (Signed)
Pt here for follow up left thumb abscess.  Pt was seen here on 9/13.

## 2014-05-29 NOTE — ED Notes (Signed)
Discussed w Dr Caron Presume, and stated this does not need treatment

## 2014-07-05 ENCOUNTER — Encounter (HOSPITAL_COMMUNITY): Payer: Self-pay | Admitting: Emergency Medicine

## 2014-07-05 ENCOUNTER — Emergency Department (HOSPITAL_COMMUNITY)
Admission: EM | Admit: 2014-07-05 | Discharge: 2014-07-05 | Disposition: A | Discharge status: Home / Self Care | Payer: BC Managed Care – PPO | Source: Home / Self Care | Attending: Emergency Medicine | Admitting: Emergency Medicine

## 2014-07-05 DIAGNOSIS — L03012 Cellulitis of left finger: Secondary | ICD-10-CM

## 2014-07-05 MED ORDER — SILVER NITRATE-POT NITRATE 75-25 % EX MISC
CUTANEOUS | Status: AC
  Filled 2014-07-05: qty 1

## 2014-07-05 MED ORDER — LIDOCAINE HCL 2 % IJ SOLN
INTRAMUSCULAR | Status: AC
  Filled 2014-07-05: qty 20

## 2014-07-05 MED ORDER — SULFAMETHOXAZOLE-TRIMETHOPRIM 800-160 MG PO TABS: 2.0000 | ORAL_TABLET | Freq: Two times a day (BID) | ORAL | Status: DC

## 2014-07-05 MED ORDER — BACITRACIN ZINC 500 UNIT/GM EX OINT
TOPICAL_OINTMENT | CUTANEOUS | Status: AC
  Filled 2014-07-05: qty 0.9

## 2014-07-05 NOTE — Discharge Instructions (Signed)
Take antibiotic as prescribed. Warm soaks TID.

## 2014-07-05 NOTE — ED Notes (Signed)
63 year old male with pain and swelling to the paronychial area of the right thumb.

## 2014-07-05 NOTE — ED Provider Notes (Signed)
CSN: [id number]     Arrival date & time 07/05/14  1013 History   First MD Initiated Contact with Patient 07/05/14 1019     Chief Complaint  Patient presents with  . Hand Pain   (Consider location/radiation/quality/duration/timing/severity/associated sxs/prior Treatment) HPI He is a 63 year old man here for evaluation of left thumb pain. He has previously been treated for paronychia about 6 weeks ago in the same location. He states it had healed up well, but over the last few days it has flared up again. The lateral nail fold is red and swollen. It is also very tender. He states he has seen some crusty discharge at the lateral nail edge. He does not bite his nails or do any tasks that involve chronic submerging of the finger.  Past Medical History  Diagnosis Date  . Hypertension   . Hyperlipidemia   . Myocardial infarction    Past Surgical History  Procedure Laterality Date  . Lipoma excision  2003  . Coronary angioplasty     Family History  Problem Relation Age of Onset  . Hypertension Mother   . Diabetes Mother   . Hyperlipidemia Mother   . Kidney disease Mother   . Cancer Father   . Hypertension Sister    History  Substance Use Topics  . Smoking status: Never Smoker   . Smokeless tobacco: Not on file  . Alcohol Use: No    Review of Systems  Constitutional: Negative for fever and chills.  Gastrointestinal: Negative for nausea and vomiting.  Skin: Positive for wound (paronychia).    Allergies  Penicillins  Home Medications   Prior to Admission medications   Medication Sig Start Date End Date Taking? Authorizing Provider  amLODipine (NORVASC) 10 MG tablet Take 1 tablet (10 mg total) by mouth daily. 05/08/14   Corky CraftsJayadeep S Varanasi, MD  aspirin 81 MG tablet Take 81 mg by mouth daily.    Historical Provider, MD  carvedilol (COREG) 6.25 MG tablet 1 tablet by mouth twice a day 04/09/14   Corky CraftsJayadeep S Varanasi, MD  clindamycin (CLEOCIN) 300 MG capsule Take 1 capsule (300 mg  total) by mouth 3 (three) times daily. 05/17/14   Adrian BlackwaterZachary H Baker, PA-C  losartan (COZAAR) 100 MG tablet Take 1 tablet (100 mg total) by mouth daily. 05/08/14   Corky CraftsJayadeep S Varanasi, MD  metFORMIN (GLUCOPHAGE) 500 MG tablet Take 500 mg by mouth 2 (two) times daily with a meal.  02/14/14   Historical Provider, MD  sulfamethoxazole-trimethoprim (SEPTRA DS) 800-160 MG per tablet Take 2 tablets by mouth 2 (two) times daily. 07/05/14   Charm RingsErin J Honig, MD   BP 157/91 mmHg  Pulse 88  Temp(Src) 98.8 F (37.1 C) (Oral)  Resp 18  SpO2 97% Physical Exam  Constitutional: He is oriented to person, place, and time. He appears well-developed and well-nourished.  Cardiovascular: Normal rate.   Pulmonary/Chest: Effort normal.  Neurological: He is alert and oriented to person, place, and time.  Skin:  Lateral left thumb nail is erythematous, tender and swollen. No obvious abscess.  The lateral half of the nail is starting to raise off the nail bed.      ED Course  NAIL REMOVAL Date/Time: 07/05/2014 12:32 PM Performed by: Charm RingsHONIG, ERIN J Authorized by: Charm RingsHONIG, ERIN J Consent: Verbal consent obtained. Risks and benefits: risks, benefits and alternatives were discussed Consent given by: patient Patient understanding: patient states understanding of the procedure being performed Patient identity confirmed: verbally with patient Time out: Immediately prior to  procedure a "time out" was called to verify the correct patient, procedure, equipment, support staff and site/side marked as required. Location: left hand Location details: left thumb Anesthesia: digital block Local anesthetic: lidocaine 2% without epinephrine Anesthetic total: 3 ml Preparation: skin prepped with Betadine Amount removed: 1/2 Side: radial Wedge excision of skin of nail fold: no Nail bed sutured: no Nail matrix removed: none Dressing: antibiotic ointment and 4x4 Patient tolerance: Patient tolerated the procedure well with no immediate  complications   (including critical care time) Labs Review Labs Reviewed - No data to display  Imaging Review No results found.   MDM   1. Paronychia of left thumb    Area of fluctuance. I'm concerned that the nail is acting as a source of infection. Removed the radial half of the thumbnail. Will treat with Bactrim DS 2 pills twice a day for 7 days. Recommended warm soaks 3 times a day for the next few days. Follow-up as needed.    Charm RingsErin J Honig, MD 07/05/14 1235

## 2014-07-11 ENCOUNTER — Emergency Department (INDEPENDENT_AMBULATORY_CARE_PROVIDER_SITE_OTHER)
Admission: EM | Admit: 2014-07-11 | Discharge: 2014-07-11 | Disposition: A | Discharge status: Home / Self Care | Payer: BC Managed Care – PPO | Source: Home / Self Care | Attending: Family Medicine | Admitting: Family Medicine

## 2014-07-11 ENCOUNTER — Encounter (HOSPITAL_COMMUNITY): Payer: Self-pay | Admitting: *Deleted

## 2014-07-11 DIAGNOSIS — L03012 Cellulitis of left finger: Secondary | ICD-10-CM

## 2014-07-11 NOTE — Discharge Instructions (Signed)
Thank you for coming in today. Keep the wound covered with ointment.   Paronychia  Paronychia is an infection of the skin caused by germs. It happens by the fingernail or toenail. You can avoid it by not:  Pulling on hangnails.  Nail biting.  Thumb sucking.  Cutting fingernails and toenails too short.  Cutting the skin at the base and sides of the fingernail or toenail (cuticle). HOME CARE  Keep the fingers or toes very dry. Put rubber gloves over cotton gloves when putting hands in water.  Keep the wound clean and bandaged (dressed) as told by your doctor.  Soak the fingers or toes in warm water for 15 to 20 minutes. Soak them 3 to 4 times per day for germ infections. Fungal infections are difficult to treat. Fungal infections often require treatment for a long time.  Only take medicine as told by your doctor. GET HELP RIGHT AWAY IF:   You have redness, puffiness (swelling), or pain that gets worse.  You see yellowish-white fluid (pus) coming from the wound.  You have a fever.  You have a bad smell coming from the wound or bandage. MAKE SURE YOU:  Understand these instructions.  Will watch your condition.  Will get help if you are not doing well or get worse. Document Released: 08/09/2009 Document Revised: 11/13/2011 Document Reviewed: 08/09/2009 [facility] Patient Information 2015 [place], [place]. This information is not intended to replace advice given to you by your health care provider. Make sure you discuss any questions you have with your health care provider.

## 2014-07-11 NOTE — ED Notes (Signed)
Pt  Seen  6  Days   Ago  For     Finger  Infection  Had  Nail  timmed  And  He  Reports   It is  Doing better

## 2014-07-11 NOTE — ED Provider Notes (Signed)
Sean Pittman is a 63 y.o. male who presents to Urgent Care today for Some follow-up. Patient had a partial left thumb nail removal on November 1. He is here today for follow-up. He is doing great and is asymptomatic. No fevers or chills nausea vomiting or diarrhea.   Past Medical History  Diagnosis Date  . Hypertension   . Hyperlipidemia   . Myocardial infarction    Past Surgical History  Procedure Laterality Date  . Lipoma excision  2003  . Coronary angioplasty     History  Substance Use Topics  . Smoking status: Never Smoker   . Smokeless tobacco: Not on file  . Alcohol Use: No   ROS as above Medications: No current facility-administered medications for this encounter.   Current Outpatient Prescriptions  Medication Sig Dispense Refill  . amLODipine (NORVASC) 10 MG tablet Take 1 tablet (10 mg total) by mouth daily. 90 tablet 2  . aspirin 81 MG tablet Take 81 mg by mouth daily.    . carvedilol (COREG) 6.25 MG tablet 1 tablet by mouth twice a day 180 tablet 2  . clindamycin (CLEOCIN) 300 MG capsule Take 1 capsule (300 mg total) by mouth 3 (three) times daily. 21 capsule 0  . losartan (COZAAR) 100 MG tablet Take 1 tablet (100 mg total) by mouth daily. 90 tablet 2  . metFORMIN (GLUCOPHAGE) 500 MG tablet Take 500 mg by mouth 2 (two) times daily with a meal.     . sulfamethoxazole-trimethoprim (SEPTRA DS) 800-160 MG per tablet Take 2 tablets by mouth 2 (two) times daily. 28 tablet 0   Allergies  Allergen Reactions  . Penicillins     Childhood      Exam:  BP 161/84 mmHg  Pulse 79  Temp(Src) 98.1 F (36.7 C) (Oral)  Resp 18  SpO2 97% Gen: Well NAD Left thumb: radial side crusted and nontender. No erythema.  No results found for this or any previous visit (from the past 24 hour(s)). No results found.  Assessment and Plan: 63 y.o. male with well-appearing status post partial thumbnail removal. Continue wound management. Follow-up as needed.  Discussed warning signs  or symptoms. Please see discharge instructions. Patient expresses understanding.     Rodolph BongEvan S Kyley Laurel, MD 07/11/14 [id number]

## 2014-08-13 ENCOUNTER — Encounter (HOSPITAL_COMMUNITY): Payer: Self-pay | Admitting: Interventional Cardiology

## 2014-08-29 ENCOUNTER — Encounter (HOSPITAL_COMMUNITY): Payer: Self-pay | Admitting: *Deleted

## 2014-08-29 ENCOUNTER — Emergency Department (HOSPITAL_COMMUNITY)
Admission: EM | Admit: 2014-08-29 | Discharge: 2014-08-29 | Disposition: A | Discharge status: Home / Self Care | Payer: BC Managed Care – PPO | Source: Home / Self Care | Attending: Family Medicine | Admitting: Family Medicine

## 2014-08-29 DIAGNOSIS — L03012 Cellulitis of left finger: Secondary | ICD-10-CM

## 2014-08-29 MED ORDER — DOXYCYCLINE HYCLATE 100 MG PO CAPS: 100.0000 mg | ORAL_CAPSULE | Freq: Two times a day (BID) | ORAL | Status: DC

## 2014-08-29 NOTE — ED Provider Notes (Signed)
Sean Pittman is a 63 y.o. male who presents to Urgent Care today for left thumb paronychia. Patient has been seen for this issue several times since September. At some point he had a partial thumbnail removal. He did well however recently he began having worsening swelling and discharge. No fevers or chills. Pain is minimal. He feels well otherwise. He has not been seen by his primary care provider for this issue.   Past Medical History  Diagnosis Date  . Hypertension   . Hyperlipidemia   . Myocardial infarction    Past Surgical History  Procedure Laterality Date  . Lipoma excision  2003  . Coronary angioplasty    . Left heart catheterization with coronary angiogram N/A 12/08/2011    Procedure: LEFT HEART CATHETERIZATION WITH CORONARY ANGIOGRAM;  Surgeon: Corky CraftsJayadeep S Varanasi, MD;  [facility];  Service: Cardiovascular;  Laterality: N/A;   History  Substance Use Topics  . Smoking status: Never Smoker   . Smokeless tobacco: Not on file  . Alcohol Use: No   ROS as above Medications: No current facility-administered medications for this encounter.   Current Outpatient Prescriptions  Medication Sig Dispense Refill  . amLODipine (NORVASC) 10 MG tablet Take 1 tablet (10 mg total) by mouth daily. 90 tablet 2  . aspirin 81 MG tablet Take 81 mg by mouth daily.    . carvedilol (COREG) 6.25 MG tablet 1 tablet by mouth twice a day 180 tablet 2  . losartan (COZAAR) 100 MG tablet Take 1 tablet (100 mg total) by mouth daily. 90 tablet 2  . metFORMIN (GLUCOPHAGE) 500 MG tablet Take 500 mg by mouth 2 (two) times daily with a meal.     . doxycycline (VIBRAMYCIN) 100 MG capsule Take 1 capsule (100 mg total) by mouth 2 (two) times daily. 20 capsule 0   Allergies  Allergen Reactions  . Penicillins     Childhood      Exam:  BP 155/87 mmHg  Pulse 78  Temp(Src) 98 F (36.7 C) (Oral)  Resp 16  SpO2 97% Gen: Well NAD. Left thumb ulnar nail fold erythematous with yellowish material.  No fluctuance palpated. No full dose elevated no pus was expressed. Capillary refill and sensation intact.   No results found for this or any previous visit (from the past 24 hour(s)). No results found.  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 Treat with doxycycline refer to hand surgery.  Discussed warning signs or symptoms. Please see discharge instructions. Patient expresses understanding.     Rodolph BongEvan S Falisha Osment, MD 08/29/14 [phone]

## 2014-08-29 NOTE — ED Notes (Signed)
Pt had laceration to left thumb nail in 05/2014 - was placed on abx for infection; got re-infected 07/2014 - had I&D and placed on abx with improvement.  States noticed dark crusting to nail over past 2 wks; last night scab came off & had purulent drainage.  Thumb swollen, red.  No streaking noted.  Denies fevers, denies numbness/tingling.

## 2014-08-29 NOTE — Discharge Instructions (Signed)
Thank you for coming in today. Take doxycycline as directed Follow-up with Dr. Merrilee SeashoreKuzma's office  Paronychia Paronychia is an inflammatory reaction involving the folds of the skin surrounding the fingernail. This is commonly caused by an infection in the skin around a nail. The most common cause of paronychia is frequent wetting of the hands (as seen with bartenders, food servers, nurses or others who wet their hands). This makes the skin around the fingernail susceptible to infection by bacteria (germs) or fungus. Other predisposing factors are:  Aggressive manicuring.  Nail biting.  Thumb sucking. The most common cause is a staphylococcal (a type of germ) infection, or a fungal (Candida) infection. When caused by a germ, it usually comes on suddenly with redness, swelling, pus and is often painful. It may get under the nail and form an abscess (collection of pus), or form an abscess around the nail. If the nail itself is infected with a fungus, the treatment is usually prolonged and may require oral medicine for up to one year. Your caregiver will determine the length of time treatment is required. The paronychia caused by bacteria (germs) may largely be avoided by not pulling on hangnails or picking at cuticles. When the infection occurs at the tips of the finger it is called felon. When the cause of paronychia is from the herpes simplex virus (HSV) it is called herpetic whitlow. TREATMENT  When an abscess is present treatment is often incision and drainage. This means that the abscess must be cut open so the pus can get out. When this is done, the following home care instructions should be followed. HOME CARE INSTRUCTIONS   It is important to keep the affected fingers very dry. Rubber or plastic gloves over cotton gloves should be used whenever the hand must be placed in water.  Keep wound clean, dry and dressed as suggested by your caregiver between warm soaks or warm compresses.  Soak in warm  water for fifteen to twenty minutes three to four times per day for bacterial infections. Fungal infections are very difficult to treat, so often require treatment for long periods of time.  For bacterial (germ) infections take antibiotics (medicine which kill germs) as directed and finish the prescription, even if the problem appears to be solved before the medicine is gone.  Only take over-the-counter or prescription medicines for pain, discomfort, or fever as directed by your caregiver. SEEK IMMEDIATE MEDICAL CARE IF:  You have redness, swelling, or increasing pain in the wound.  You notice pus coming from the wound.  You have a fever.  You notice a bad smell coming from the wound or dressing. Document Released: 02/14/2001 Document Revised: 11/13/2011 Document Reviewed: 10/16/2008 [employer] Patient Information 2015 [place] [place]. This information is not intended to replace advice given to you by your health care provider. Make sure you discuss any questions you have with your health care provider.

## 2014-11-30 ENCOUNTER — Other Ambulatory Visit: Payer: Self-pay

## 2014-11-30 MED ORDER — LOSARTAN POTASSIUM 100 MG PO TABS: 100.0000 mg | ORAL_TABLET | Freq: Every day | ORAL | Status: DC

## 2014-11-30 MED ORDER — CARVEDILOL 6.25 MG PO TABS: ORAL_TABLET | ORAL | Status: DC

## 2014-11-30 MED ORDER — AMLODIPINE BESYLATE 10 MG PO TABS: 10.0000 mg | ORAL_TABLET | Freq: Every day | ORAL | Status: DC

## 2015-04-16 ENCOUNTER — Ambulatory Visit: Payer: Self-pay | Admitting: Interventional Cardiology

## 2015-05-21 ENCOUNTER — Ambulatory Visit: Payer: Self-pay | Admitting: Interventional Cardiology

## 2015-06-08 NOTE — Progress Notes (Signed)
Cardiology Office Note   Date:  06/09/2015   ID:  Sean Pittman, DOB 04-29-1951, MRN [id number]  [facility]  [facility]) Cardiologist:  Dr. Everette Rank   Electrophysiologist:  n/a  Chief Complaint  Patient presents with  . Cardiomyopathy     History of Present Illness: Sean Pittman is a 64 y.o. male with a hx of apical hypertrophy, HTN.  Last seen by Dr. Everette Rank 7/15.  Returns for routine follow-up. Overall doing well.  The patient denies any chest pain, significant dyspnea, syncope, orthopnea, PND, edema.   Studies/Reports Reviewed Today:  LHC 4/13 LM: widely patent. LAD:  minimal atherosclerosis. LCx: angiographically normal. RCA: patent. EF 60%. LVEDP was 30 mmHg. Evidence of apical hypertrophy.    Past Medical History  Diagnosis Date  . Hypertension   . Apical variant hypertrophic cardiomyopathy (HCC)   . S/P cardiac catheterization     LHC 4/13: no sig CAD, EF 60%. LVEDP was 30 mmHg. Evidence of apical hypertrophy.    Past Surgical History  Procedure Laterality Date  . Lipoma excision  2003  . Coronary angioplasty    . Left heart catheterization with coronary angiogram N/A 12/08/2011    Procedure: LEFT HEART CATHETERIZATION WITH CORONARY ANGIOGRAM;  Surgeon: Corky Crafts, MD;  Location: [facility] CATH LAB;  Service: Cardiovascular;  Laterality: N/A;     Current Outpatient Prescriptions  Medication Sig Dispense Refill  . amLODipine (NORVASC) 10 MG tablet Take 1 tablet (10 mg total) by mouth daily. 90 tablet 1  . aspirin 81 MG tablet Take 81 mg by mouth daily.    . carvedilol (COREG) 6.25 MG tablet 1 tablet by mouth twice a day 180 tablet 1  . losartan (COZAAR) 100 MG tablet Take 1 tablet (100 mg total) by mouth daily. 90 tablet 1  . metFORMIN (GLUCOPHAGE) 500 MG tablet Take 500 mg by mouth 2 (two) times daily with a meal.      No current facility-administered medications for this visit.    Allergies:    Penicillins    Social History:  The patient  reports that he has never smoked. He does not have any smokeless tobacco history on file. He reports that he does not drink alcohol or use illicit drugs.   Family History:  The patient's family history includes Cancer in his father; Diabetes in his mother; Hyperlipidemia in his mother; Hypertension in his mother and sister; Kidney disease in his mother.    ROS:   Please see the history of present illness.   Review of Systems  All other systems reviewed and are negative.     PHYSICAL EXAM: VS:  BP 142/70 mmHg  Pulse 67  Ht 5' 4.5" (1.638 m)  Wt 171 lb 6.4 oz (77.747 kg)  BMI 28.98 kg/m2    Wt Readings from Last 3 Encounters:  06/09/15 171 lb 6.4 oz (77.747 kg)  03/16/14 173 lb (78.472 kg)  12/09/11 177 lb 14.6 oz (80.7 kg)     GEN: Well nourished, well developed, in no acute distress HEENT: normal Neck: no JVD, no carotid bruits, no masses Cardiac:  Normal S1/S2, RRR; 2/6 systolic murmur RUSB,  no rubs or gallops, no edema   Respiratory:  clear to auscultation bilaterally, no wheezing, rhonchi or rales. GI: soft, nontender, nondistended, + BS MS: no deformity or atrophy Skin: warm and dry  Neuro:  CNs II-XII intact, Strength and sensation are intact Psych: Normal affect   EKG:  EKG is ordered today.  It demonstrates:   NSR, HR 67, LAD, RSR', NSSTTW changes   Recent Labs: No results found for requested labs within last 365 days.    Lipid Panel    Component Value Date/Time   CHOL 151 12/08/2011 1000   TRIG 43 12/08/2011 1000   HDL 47 12/08/2011 1000   CHOLHDL 3.2 12/08/2011 1000   VLDL 9 12/08/2011 1000   LDLCALC 95 12/08/2011 1000      ASSESSMENT AND PLAN:  1. HTN:  Controlled.  Repeat by me 132/80.  BP at home typically 130s systolic.  Continue current regimen which includes norvasc, coreg, cozaar.  FU BMET.  2. Apical Hypertrophy:  No symptoms of dyspnea, angina, and presyncope or syncope.  I suspect his  murmur is related to apical hypertrophy.  Will arrange FU echo.      Medication Changes: Current medicines are reviewed at length with the patient today.  Concerns regarding medicines are as outlined above.  The following changes have been made:   Discontinued Medications   DOXYCYCLINE (VIBRAMYCIN) 100 MG CAPSULE    Take 1 capsule (100 mg total) by mouth 2 (two) times daily.   Modified Medications   No medications on file   New Prescriptions   No medications on file   Labs/ tests ordered today include:   Orders Placed This Encounter  Procedures  . Basic Metabolic Panel (BMET)  . EKG 12-Lead  . Echocardiogram     Disposition:    FU with Dr. Everette Rank 1 year.    Signed, Brynda Rim, MHS 06/09/2015 1:18 PM    [facility] [address] [place], [place], [place]  [address] Phone: [phone]; Fax: [phone]

## 2015-06-09 ENCOUNTER — Encounter: Payer: Self-pay | Admitting: Physician Assistant

## 2015-06-09 ENCOUNTER — Ambulatory Visit (INDEPENDENT_AMBULATORY_CARE_PROVIDER_SITE_OTHER): Payer: Medicare HMO | Admitting: Physician Assistant

## 2015-06-09 VITALS — BP 142/70 | HR 67 | Ht 64.5 in | Wt 171.4 lb

## 2015-06-09 DIAGNOSIS — I10 Essential (primary) hypertension: Secondary | ICD-10-CM | POA: Diagnosis not present

## 2015-06-09 DIAGNOSIS — I422 Other hypertrophic cardiomyopathy: Secondary | ICD-10-CM | POA: Diagnosis not present

## 2015-06-09 LAB — BASIC METABOLIC PANEL
BUN: 11 mg/dL (ref 7–25)
CHLORIDE: 99 mmol/L (ref 98–110)
CO2: 22 mmol/L (ref 20–31)
Calcium: 9.4 mg/dL (ref 8.6–10.3)
Creat: 0.96 mg/dL (ref 0.70–1.25)
GLUCOSE: 271 mg/dL — AB (ref 65–99)
Potassium: 3.7 mmol/L (ref 3.5–5.3)
SODIUM: 136 mmol/L (ref 135–146)

## 2015-06-09 NOTE — Patient Instructions (Signed)
Medication Instructions:  Your physician recommends that you continue on your current medications as directed. Please refer to the Current Medication list given to you today.   Labwork: TODAY BMET  Testing/Procedures: Your physician has requested that you have an echocardiogram DX APICAL HYPERTROPHY . Echocardiography is a painless test that uses sound waves to create images of your heart. It provides your doctor with information about the size and shape of your heart and how well your heart's chambers and valves are working. This procedure takes approximately one hour. There are no restrictions for this procedure.   Follow-Up: Your physician wants you to follow-up in: 1 YEAR WITH DR. VARANASI. You will receive a reminder letter in the mail two months in advance. If you don't receive a letter, please call our office to schedule the follow-up appointment.   Any Other Special Instructions Will Be Listed Below (If Applicable).

## 2015-06-10 ENCOUNTER — Telehealth: Payer: Self-pay | Admitting: *Deleted

## 2015-06-10 NOTE — Telephone Encounter (Signed)
Pt notified of lab results by phone with verbal understanding. Pt advised to f/u w/PCP about elevated glucose.  

## 2015-06-11 DIAGNOSIS — B351 Tinea unguium: Secondary | ICD-10-CM | POA: Diagnosis not present

## 2015-06-18 ENCOUNTER — Other Ambulatory Visit (HOSPITAL_COMMUNITY): Payer: Medicare HMO

## 2015-06-28 ENCOUNTER — Telehealth: Payer: Self-pay | Admitting: Interventional Cardiology

## 2015-06-28 MED ORDER — AMLODIPINE BESYLATE 10 MG PO TABS: 10.0000 mg | ORAL_TABLET | Freq: Every day | ORAL | Status: DC

## 2015-06-28 MED ORDER — LOSARTAN POTASSIUM 100 MG PO TABS: 100.0000 mg | ORAL_TABLET | Freq: Every day | ORAL | Status: DC

## 2015-06-28 NOTE — Telephone Encounter (Signed)
New message   STAT if patient is at the pharmacy , call can be transferred to refill team.   1. Which medications need to be refilled? Losartan and Amlodipine   2. Which pharmacy/location is medication to be sent to? [facility] on [address]   3. Do they need a 30 day or 90 day supply? 90 day supply  Pt states he has already called it into the pharmacy

## 2015-07-05 ENCOUNTER — Ambulatory Visit (HOSPITAL_COMMUNITY): Payer: Medicare HMO | Attending: Physician Assistant

## 2015-07-05 ENCOUNTER — Other Ambulatory Visit: Payer: Self-pay

## 2015-07-05 DIAGNOSIS — I10 Essential (primary) hypertension: Secondary | ICD-10-CM | POA: Diagnosis not present

## 2015-07-05 DIAGNOSIS — I5189 Other ill-defined heart diseases: Secondary | ICD-10-CM | POA: Diagnosis not present

## 2015-07-05 DIAGNOSIS — I517 Cardiomegaly: Secondary | ICD-10-CM | POA: Insufficient documentation

## 2015-07-05 DIAGNOSIS — I422 Other hypertrophic cardiomyopathy: Secondary | ICD-10-CM

## 2015-07-06 ENCOUNTER — Encounter: Payer: Self-pay | Admitting: Physician Assistant

## 2015-07-06 ENCOUNTER — Telehealth: Payer: Self-pay | Admitting: *Deleted

## 2015-07-06 NOTE — Telephone Encounter (Signed)
Pt notified of echo results and findings by phone with verbal understanding to results given today.

## 2015-07-14 DIAGNOSIS — R69 Illness, unspecified: Secondary | ICD-10-CM | POA: Diagnosis not present

## 2015-08-31 ENCOUNTER — Other Ambulatory Visit: Payer: Self-pay | Admitting: *Deleted

## 2015-08-31 DIAGNOSIS — R69 Illness, unspecified: Secondary | ICD-10-CM | POA: Diagnosis not present

## 2015-08-31 MED ORDER — CARVEDILOL 6.25 MG PO TABS: ORAL_TABLET | ORAL | Status: DC

## 2016-01-12 DIAGNOSIS — R69 Illness, unspecified: Secondary | ICD-10-CM | POA: Diagnosis not present

## 2016-01-18 DIAGNOSIS — R202 Paresthesia of skin: Secondary | ICD-10-CM | POA: Diagnosis not present

## 2016-01-18 DIAGNOSIS — M542 Cervicalgia: Secondary | ICD-10-CM | POA: Diagnosis not present

## 2016-01-18 DIAGNOSIS — Z79891 Long term (current) use of opiate analgesic: Secondary | ICD-10-CM | POA: Diagnosis not present

## 2016-01-18 DIAGNOSIS — I10 Essential (primary) hypertension: Secondary | ICD-10-CM | POA: Diagnosis not present

## 2016-01-18 DIAGNOSIS — M25511 Pain in right shoulder: Secondary | ICD-10-CM | POA: Diagnosis not present

## 2016-01-18 DIAGNOSIS — I252 Old myocardial infarction: Secondary | ICD-10-CM | POA: Diagnosis not present

## 2016-01-18 DIAGNOSIS — R200 Anesthesia of skin: Secondary | ICD-10-CM | POA: Diagnosis not present

## 2016-01-18 DIAGNOSIS — M8580 Other specified disorders of bone density and structure, unspecified site: Secondary | ICD-10-CM | POA: Diagnosis not present

## 2016-01-18 DIAGNOSIS — E119 Type 2 diabetes mellitus without complications: Secondary | ICD-10-CM | POA: Diagnosis not present

## 2016-01-18 DIAGNOSIS — S199XXA Unspecified injury of neck, initial encounter: Secondary | ICD-10-CM | POA: Diagnosis not present

## 2016-01-18 DIAGNOSIS — S134XXA Sprain of ligaments of cervical spine, initial encounter: Secondary | ICD-10-CM | POA: Diagnosis not present

## 2016-01-18 DIAGNOSIS — S139XXA Sprain of joints and ligaments of unspecified parts of neck, initial encounter: Secondary | ICD-10-CM | POA: Diagnosis not present

## 2016-01-20 ENCOUNTER — Telehealth: Payer: Self-pay | Admitting: Interventional Cardiology

## 2016-01-20 DIAGNOSIS — S134XXA Sprain of ligaments of cervical spine, initial encounter: Secondary | ICD-10-CM | POA: Diagnosis not present

## 2016-01-20 NOTE — Telephone Encounter (Signed)
New problem    Pt's Losartan wasn't refilled and she wanted to let us know. Please advise

## 2016-01-20 NOTE — Telephone Encounter (Signed)
Spoke with pt and he states that he is taking his Losartan as prescribed. Pt states that he picks it up from a local [facility] and has had no trouble. Pt states he has about a month's worth left and then will pick up new one. Reminded pt he is due back to see Dr. Eldridge DaceVaranasi in October and to call sooner if he needs us. Pt verbalized understanding.

## 2016-01-21 DIAGNOSIS — M542 Cervicalgia: Secondary | ICD-10-CM | POA: Diagnosis not present

## 2016-01-21 DIAGNOSIS — S299XXA Unspecified injury of thorax, initial encounter: Secondary | ICD-10-CM | POA: Diagnosis not present

## 2016-01-21 DIAGNOSIS — S199XXA Unspecified injury of neck, initial encounter: Secondary | ICD-10-CM | POA: Diagnosis not present

## 2016-01-21 DIAGNOSIS — M546 Pain in thoracic spine: Secondary | ICD-10-CM | POA: Diagnosis not present

## 2016-02-25 ENCOUNTER — Ambulatory Visit: Payer: Medicare HMO | Attending: Chiropractic Medicine | Admitting: Physical Therapy

## 2016-04-12 DIAGNOSIS — R69 Illness, unspecified: Secondary | ICD-10-CM | POA: Diagnosis not present

## 2016-04-15 DIAGNOSIS — H52209 Unspecified astigmatism, unspecified eye: Secondary | ICD-10-CM | POA: Diagnosis not present

## 2016-04-15 DIAGNOSIS — H5213 Myopia, bilateral: Secondary | ICD-10-CM | POA: Diagnosis not present

## 2016-04-15 DIAGNOSIS — H5203 Hypermetropia, bilateral: Secondary | ICD-10-CM | POA: Diagnosis not present

## 2016-04-15 DIAGNOSIS — Z0100 Encounter for examination of eyes and vision without abnormal findings: Secondary | ICD-10-CM | POA: Diagnosis not present

## 2016-04-15 DIAGNOSIS — H524 Presbyopia: Secondary | ICD-10-CM | POA: Diagnosis not present

## 2016-05-31 DIAGNOSIS — R69 Illness, unspecified: Secondary | ICD-10-CM | POA: Diagnosis not present

## 2016-06-15 DIAGNOSIS — N390 Urinary tract infection, site not specified: Secondary | ICD-10-CM | POA: Diagnosis not present

## 2016-06-15 DIAGNOSIS — S335XXA Sprain of ligaments of lumbar spine, initial encounter: Secondary | ICD-10-CM | POA: Diagnosis not present

## 2016-06-15 DIAGNOSIS — M545 Low back pain: Secondary | ICD-10-CM | POA: Diagnosis not present

## 2016-06-15 DIAGNOSIS — I10 Essential (primary) hypertension: Secondary | ICD-10-CM | POA: Diagnosis not present

## 2016-06-15 DIAGNOSIS — E119 Type 2 diabetes mellitus without complications: Secondary | ICD-10-CM | POA: Diagnosis not present

## 2016-06-16 ENCOUNTER — Encounter (HOSPITAL_COMMUNITY): Payer: Self-pay

## 2016-06-16 ENCOUNTER — Emergency Department (HOSPITAL_COMMUNITY): Payer: Medicare HMO

## 2016-06-16 ENCOUNTER — Emergency Department (HOSPITAL_COMMUNITY)
Admission: EM | Admit: 2016-06-16 | Discharge: 2016-06-16 | Disposition: A | Discharge status: Home / Self Care | Payer: Medicare HMO | Attending: Emergency Medicine | Admitting: Emergency Medicine

## 2016-06-16 DIAGNOSIS — Y929 Unspecified place or not applicable: Secondary | ICD-10-CM | POA: Diagnosis not present

## 2016-06-16 DIAGNOSIS — I10 Essential (primary) hypertension: Secondary | ICD-10-CM | POA: Insufficient documentation

## 2016-06-16 DIAGNOSIS — X58XXXA Exposure to other specified factors, initial encounter: Secondary | ICD-10-CM | POA: Diagnosis not present

## 2016-06-16 DIAGNOSIS — Z7982 Long term (current) use of aspirin: Secondary | ICD-10-CM | POA: Insufficient documentation

## 2016-06-16 DIAGNOSIS — Z7984 Long term (current) use of oral hypoglycemic drugs: Secondary | ICD-10-CM | POA: Insufficient documentation

## 2016-06-16 DIAGNOSIS — Y939 Activity, unspecified: Secondary | ICD-10-CM | POA: Diagnosis not present

## 2016-06-16 DIAGNOSIS — S39012A Strain of muscle, fascia and tendon of lower back, initial encounter: Secondary | ICD-10-CM | POA: Insufficient documentation

## 2016-06-16 DIAGNOSIS — M545 Low back pain: Secondary | ICD-10-CM | POA: Diagnosis not present

## 2016-06-16 DIAGNOSIS — R52 Pain, unspecified: Secondary | ICD-10-CM | POA: Diagnosis not present

## 2016-06-16 DIAGNOSIS — S3992XA Unspecified injury of lower back, initial encounter: Secondary | ICD-10-CM | POA: Diagnosis present

## 2016-06-16 DIAGNOSIS — Y999 Unspecified external cause status: Secondary | ICD-10-CM | POA: Diagnosis not present

## 2016-06-16 DIAGNOSIS — M5489 Other dorsalgia: Secondary | ICD-10-CM | POA: Diagnosis not present

## 2016-06-16 LAB — CBC
HCT: 40.9 % (ref 39.0–52.0)
Hemoglobin: 14.1 g/dL (ref 13.0–17.0)
MCH: 29.3 pg (ref 26.0–34.0)
MCHC: 34.5 g/dL (ref 30.0–36.0)
MCV: 85 fL (ref 78.0–100.0)
PLATELETS: 174 10*3/uL (ref 150–400)
RBC: 4.81 MIL/uL (ref 4.22–5.81)
RDW: 12.9 % (ref 11.5–15.5)
WBC: 11.5 10*3/uL — AB (ref 4.0–10.5)

## 2016-06-16 LAB — COMPREHENSIVE METABOLIC PANEL
ALT: 35 U/L (ref 17–63)
AST: 35 U/L (ref 15–41)
Albumin: 4.1 g/dL (ref 3.5–5.0)
Alkaline Phosphatase: 103 U/L (ref 38–126)
Anion gap: 10 (ref 5–15)
BUN: 14 mg/dL (ref 6–20)
CHLORIDE: 104 mmol/L (ref 101–111)
CO2: 21 mmol/L — ABNORMAL LOW (ref 22–32)
Calcium: 9.4 mg/dL (ref 8.9–10.3)
Creatinine, Ser: 0.89 mg/dL (ref 0.61–1.24)
Glucose, Bld: 303 mg/dL — ABNORMAL HIGH (ref 65–99)
Potassium: 4 mmol/L (ref 3.5–5.1)
Sodium: 135 mmol/L (ref 135–145)
Total Bilirubin: 0.6 mg/dL (ref 0.3–1.2)
Total Protein: 7.6 g/dL (ref 6.5–8.1)

## 2016-06-16 LAB — URINALYSIS, ROUTINE W REFLEX MICROSCOPIC
Bilirubin Urine: NEGATIVE
Glucose, UA: >1000 mg/dL — AB
Ketones, ur: 15 mg/dL — AB
NITRITE: NEGATIVE
Protein, ur: NEGATIVE mg/dL
SPECIFIC GRAVITY, URINE: 1.038 — AB (ref 1.005–1.030)
pH: 5.5 (ref 5.0–8.0)

## 2016-06-16 LAB — URINE MICROSCOPIC-ADD ON
BACTERIA UA: NONE SEEN
SQUAMOUS EPITHELIAL / LPF: NONE SEEN

## 2016-06-16 MED ORDER — LIDOCAINE 5 % EX PTCH
1.0000 | MEDICATED_PATCH | CUTANEOUS | Status: DC
  Administered 2016-06-16: 1 via TRANSDERMAL
  Filled 2016-06-16: qty 1

## 2016-06-16 MED ORDER — LIDOCAINE 5 % EX PTCH: 1.0000 | MEDICATED_PATCH | CUTANEOUS | 0 refills | Status: DC

## 2016-06-16 MED ORDER — DEXAMETHASONE SODIUM PHOSPHATE 10 MG/ML IJ SOLN
10.0000 mg | Freq: Once | INTRAMUSCULAR | Status: AC
  Administered 2016-06-16: 10 mg via INTRAMUSCULAR
  Filled 2016-06-16: qty 1

## 2016-06-16 MED ORDER — DICLOFENAC SODIUM ER 100 MG PO TB24
100.0000 mg | ORAL_TABLET | Freq: Every day | ORAL | 0 refills | Status: DC
Start: 2016-06-16 — End: 2016-10-06

## 2016-06-16 MED ORDER — KETOROLAC TROMETHAMINE 60 MG/2ML IM SOLN
60.0000 mg | Freq: Once | INTRAMUSCULAR | Status: AC
  Administered 2016-06-16: 60 mg via INTRAMUSCULAR
  Filled 2016-06-16: qty 2

## 2016-06-16 MED ORDER — METHOCARBAMOL 500 MG PO TABS
1000.0000 mg | ORAL_TABLET | Freq: Once | ORAL | Status: AC
  Administered 2016-06-16: 1000 mg via ORAL
  Filled 2016-06-16: qty 2

## 2016-06-16 NOTE — ED Notes (Signed)
Pt complains of lower back pain for two weeks, tonight he was having a hard time moving, recently dx with a UTI

## 2016-06-16 NOTE — ED Provider Notes (Signed)
[facility]-EMERGENCY DEPT Provider Note   CSN: [id number] Arrival date & time: 06/16/16  0239     History   Chief Complaint Chief Complaint  Patient presents with  . Back Pain    HPI Daymen Hassebrock is a 65 y.o. male.  The history is provided by the patient.  Back Pain   This is a recurrent problem. The current episode started more than 1 week ago. The problem occurs constantly. The problem has not changed since onset.The pain is associated with no known injury. The pain is present in the lumbar spine. The pain does not radiate. The pain is moderate. The symptoms are aggravated by certain positions. The pain is the same all the time. Pertinent negatives include no chest pain, no fever, no numbness, no weight loss, no headaches, no abdominal pain, no abdominal swelling, no bowel incontinence, no perianal numbness, no bladder incontinence, no dysuria, no pelvic pain, no leg pain, no paresthesias, no paresis, no tingling and no weakness. He has tried nothing for the symptoms. The treatment provided no relief.    Past Medical History:  Diagnosis Date  . Apical variant hypertrophic cardiomyopathy (HCC)   . History of echocardiogram    Echo 11/16: Moderate LVH, vigorous LVF, EF 55-60%, normal wall motion, grade 1 diastolic dysfunction  . Hypertension   . S/P cardiac catheterization    LHC 4/13: no sig CAD, EF 60%. LVEDP was 30 mmHg. Evidence of apical hypertrophy.    Patient Active Problem List   Diagnosis Date Noted  . Nonspecific abnormal unspecified cardiovascular function study 03/16/2014  . Nonspecific abnormal electrocardiogram (ECG) (EKG) 03/16/2014  . Essential hypertension, benign 03/16/2014    Past Surgical History:  Procedure Laterality Date  . CORONARY ANGIOPLASTY    . LEFT HEART CATHETERIZATION WITH CORONARY ANGIOGRAM N/A 12/08/2011   Procedure: LEFT HEART CATHETERIZATION WITH CORONARY ANGIOGRAM;  Surgeon: Corky Crafts, MD;  Location: [facility] CATH LAB;  Service:  Cardiovascular;  Laterality: N/A;  . LIPOMA EXCISION  2003       Home Medications    Prior to Admission medications   Medication Sig Start Date End Date Taking? Authorizing Provider  amLODipine (NORVASC) 10 MG tablet Take 1 tablet (10 mg total) by mouth daily. 06/28/15  Yes Corky Crafts, MD  aspirin 81 MG tablet Take 81 mg by mouth daily.   Yes Historical Provider, MD  carvedilol (COREG) 6.25 MG tablet 1 tablet by mouth twice a day 08/31/15  Yes Scott T Weaver, PA-C  losartan (COZAAR) 100 MG tablet Take 1 tablet (100 mg total) by mouth daily. 06/28/15  Yes Corky Crafts, MD  metFORMIN (GLUCOPHAGE) 500 MG tablet Take 500 mg by mouth 2 (two) times daily with a meal.  02/14/14  Yes Historical Provider, MD  nitrofurantoin, macrocrystal-monohydrate, (MACROBID) 100 MG capsule Take 1 capsule by mouth 2 (two) times daily. 06/15/16  Yes Historical Provider, MD    Family History Family History  Problem Relation Age of Onset  . Hypertension Mother   . Diabetes Mother   . Hyperlipidemia Mother   . Kidney disease Mother   . Cancer Father   . Hypertension Sister     Social History Social History  Substance Use Topics  . Smoking status: Never Smoker  . Smokeless tobacco: Never Used  . Alcohol use No     Allergies   Penicillins   Review of Systems Review of Systems  Constitutional: Negative for fever and weight loss.  Cardiovascular: Negative for chest pain.  Gastrointestinal:  Negative for abdominal pain and bowel incontinence.  Genitourinary: Negative for bladder incontinence, difficulty urinating, dysuria and pelvic pain.  Musculoskeletal: Positive for back pain. Negative for gait problem and joint swelling.  Neurological: Negative for tingling, weakness, numbness, headaches and paresthesias.  All other systems reviewed and are negative.    Physical Exam Updated Vital Signs BP 154/98 (BP Location: Right Arm) Comment: Simultaneous filing. User may not have seen  previous data.  Pulse 85 Comment: Simultaneous filing. User may not have seen previous data.  Temp 98.1 F (36.7 C) (Oral)   Resp 18   SpO2 93% Comment: Simultaneous filing. User may not have seen previous data.  Physical Exam  Constitutional: He is oriented to person, place, and time. He appears well-developed and well-nourished. No distress.  HENT:  Head: Normocephalic and atraumatic.  Mouth/Throat: No oropharyngeal exudate.  Eyes: EOM are normal. Pupils are equal, round, and reactive to light.  Neck: Normal range of motion. Neck supple.  Cardiovascular: Normal rate, regular rhythm and intact distal pulses.   Pulmonary/Chest: Effort normal and breath sounds normal. No respiratory distress. He has no wheezes. He has no rales.  Abdominal: Soft. Bowel sounds are normal. He exhibits no mass. There is no tenderness. There is no rebound and no guarding.  Musculoskeletal: Normal range of motion. He exhibits no edema, tenderness or deformity.  Neurological: He is alert and oriented to person, place, and time. He has normal reflexes. He exhibits normal muscle tone.  Skin: Skin is warm and dry. Capillary refill takes less than 2 seconds.  Psychiatric: He has a normal mood and affect.     ED Treatments / Results  L Radiology No results found.  Procedures Procedures (including critical care time)  Medications Ordered in ED Medications  ketorolac (TORADOL) injection 60 mg (not administered)  lidocaine (LIDODERM) 5 % 1 patch (not administered)  dexamethasone (DECADRON) injection 10 mg (not administered)  methocarbamol (ROBAXIN) tablet 1,000 mg (not administered)     Initial Impression / Assessment and Plan / ED Course  I have reviewed the triage vital signs and the nursing notes.  Pertinent labs & imaging results that were available during my care of the patient were reviewed by me and considered in my medical decision making (see chart for details).  Results for orders placed or  performed during the hospital encounter of 06/16/16  CBC  Result Value Ref Range   WBC 11.5 (H) 4.0 - 10.5 K/uL   RBC 4.81 4.22 - 5.81 MIL/uL   Hemoglobin 14.1 13.0 - 17.0 g/dL   HCT 16.1 09.6 - 04.5 %   MCV 85.0 78.0 - 100.0 fL   MCH 29.3 26.0 - 34.0 pg   MCHC 34.5 30.0 - 36.0 g/dL   RDW 40.9 81.1 - 91.4 %   Platelets 174 150 - 400 K/uL  Comprehensive metabolic panel  Result Value Ref Range   Sodium 135 135 - 145 mmol/L   Potassium 4.0 3.5 - 5.1 mmol/L   Chloride 104 101 - 111 mmol/L   CO2 21 (L) 22 - 32 mmol/L   Glucose, Bld 303 (H) 65 - 99 mg/dL   BUN 14 6 - 20 mg/dL   Creatinine, Ser 7.82 0.61 - 1.24 mg/dL   Calcium 9.4 8.9 - 95.6 mg/dL   Total Protein 7.6 6.5 - 8.1 g/dL   Albumin 4.1 3.5 - 5.0 g/dL   AST 35 15 - 41 U/L   ALT 35 17 - 63 U/L   Alkaline Phosphatase 103 38 -  126 U/L   Total Bilirubin 0.6 0.3 - 1.2 mg/dL   GFR calc non Af Amer >60 >60 mL/min   GFR calc Af Amer >60 >60 mL/min   Anion gap 10 5 - 15  Urinalysis, Routine w reflex microscopic (not [facility])  Result Value Ref Range   Color, Urine YELLOW YELLOW   APPearance CLEAR CLEAR   Specific Gravity, Urine 1.038 (H) 1.005 - 1.030   pH 5.5 5.0 - 8.0   Glucose, UA >1000 (A) NEGATIVE mg/dL   Hgb urine dipstick TRACE (A) NEGATIVE   Bilirubin Urine NEGATIVE NEGATIVE   Ketones, ur 15 (A) NEGATIVE mg/dL   Protein, ur NEGATIVE NEGATIVE mg/dL   Nitrite NEGATIVE NEGATIVE   Leukocytes, UA TRACE (A) NEGATIVE  Urine microscopic-add on  Result Value Ref Range   Squamous Epithelial / LPF NONE SEEN NONE SEEN   WBC, UA 0-5 0 - 5 WBC/hpf   RBC / HPF 0-5 0 - 5 RBC/hpf   Bacteria, UA NONE SEEN NONE SEEN   Dg Lumbar Spine Complete  Result Date: 06/16/2016 CLINICAL DATA:  Lumbosacral back pain for 2 weeks.  No known injury. EXAM: LUMBAR SPINE - COMPLETE 4+ VIEW COMPARISON:  Radiographs 11/29/2011 FINDINGS: Straightening of normal lordosis, unchanged. No listhesis. Vertebral body heights are maintained. Mild endplate  spurring with preservation of disc space. Mild facet arthropathy at L5-S1. No bony destructive change. Sacroiliac joints are congruent. IMPRESSION: Stable mild spondylosis without acute osseous abnormality. Electronically Signed   By: Rubye OaksMelanie  Ehinger M.D.   On: 06/16/2016 05:39     Final Clinical Impressions(s) / ED Diagnoses    New Prescriptions New Prescriptions   No medications on file   All questions answered to patient's satisfaction. Based on history and exam patient has been appropriately medically screened and emergency conditions excluded. Patient is stable for discharge at this time. Follow up with your PMD for recheck in 2 days and strict return precautions given.   Cy BlamerApril Davey Bergsma, MD 06/16/16 [phone]

## 2016-06-17 ENCOUNTER — Telehealth (HOSPITAL_BASED_OUTPATIENT_CLINIC_OR_DEPARTMENT_OTHER): Payer: Self-pay

## 2016-06-17 NOTE — Progress Notes (Signed)
Telephone Encounter: Received call from Mr Sean Pittman that he was unable to Purchase his Lidocaine patches ($200.00 without Insurance) as order by April Palumbo  EDP as [employer] is requesting "prior approval" and had "faxed" to ED without response. CM called flow Management and spoke with Rose to see if they had [facility] this [facility] or [facility] flow management and they Had NOT. Rose tells me that if he will have them resend the request to Flow Management here on the  [facility] @ [phone] she can have someone confirm the Rx.  CM offered other suggestions to this pt in order for him to be able to tolerate pain until he can resolve this with his own PCP on Monday am, first thing. CM offered that  1)  PCP is always going to be best choice when pain is involved as they DO prior authorizations for these difficult meds, etc. 2) Ask Pharmacist for an OTC substitute which may not be AS GOOD, but may help until this can be resolved. 3) follow up with PCP ASAP.   Pt appreciative of all assistance.

## 2016-06-19 ENCOUNTER — Telehealth: Payer: Self-pay | Admitting: *Deleted

## 2016-06-19 NOTE — Telephone Encounter (Signed)
Pt called stating he needed prior auth from Dr Nicanor AlconPalumbo for lidocaine 5% patch Rx.  EDCM has run into this problem with same medication in the past and was advised to have pt get OTC 4% patch instead.  Pt agreeable to advise.

## 2016-08-30 ENCOUNTER — Other Ambulatory Visit: Payer: Self-pay | Admitting: *Deleted

## 2016-08-30 ENCOUNTER — Other Ambulatory Visit: Payer: Self-pay | Admitting: Interventional Cardiology

## 2016-08-30 MED ORDER — CARVEDILOL 6.25 MG PO TABS: ORAL_TABLET | ORAL | 0 refills | Status: DC

## 2016-08-30 MED ORDER — AMLODIPINE BESYLATE 10 MG PO TABS: 10.0000 mg | ORAL_TABLET | Freq: Every day | ORAL | 0 refills | Status: DC

## 2016-08-30 MED ORDER — LOSARTAN POTASSIUM 100 MG PO TABS: 100.0000 mg | ORAL_TABLET | Freq: Every day | ORAL | 0 refills | Status: DC

## 2016-10-05 NOTE — Progress Notes (Signed)
Cardiology Office Note   Date:  10/06/2016   ID:  Sean SpireMichael Eleazer, DOB 09/05/1950, MRN [id number]  PCP:  [facility]    Chief Complaint  Patient presents with  . Follow-up  Apical hypertrophy   Wt Readings from Last 3 Encounters:  10/06/16 169 lb 12.8 oz (77 kg)  06/09/15 171 lb 6.4 oz (77.7 kg)  03/16/14 173 lb (78.5 kg)       History of Present Illness: Sean Pittman is a 66 y.o. male  who has apical hypertrophy. He has had HTN as well. ECG is abnormal and he had a cath for concerning ECG in 12/2011 showing:  1. Normal left main coronary artery. 2. No significant atherosclerosis in the left anterior descending artery and its branches. 3. Normal left circumflex artery and its branches. 4. Normal right coronary artery. 5. Normal left ventricular systolic function.  LVEDP 30 mmHg.  Ejection fraction 60%.  He was last seen in our office in 2015.   BP is better controlled. 125-140/70-85 typically at home. No spikes to the 170 systolic since last visit. Hypertension:  Denies : Chest pain. Dizziness. Leg edema. Palpitations. Syncope.   He has not been exercising regularly of late.  Most strenuous activity is work around the house.     Past Medical History:  Diagnosis Date  . Apical variant hypertrophic cardiomyopathy (HCC)   . History of echocardiogram    Echo 11/16: Moderate LVH, vigorous LVF, EF 55-60%, normal wall motion, grade 1 diastolic dysfunction  . Hypertension   . S/P cardiac catheterization    LHC 4/13: no sig CAD, EF 60%. LVEDP was 30 mmHg. Evidence of apical hypertrophy.    Past Surgical History:  Procedure Laterality Date  . CORONARY ANGIOPLASTY    . LEFT HEART CATHETERIZATION WITH CORONARY ANGIOGRAM N/A 12/08/2011   Procedure: LEFT HEART CATHETERIZATION WITH CORONARY ANGIOGRAM;  Surgeon: Corky CraftsJayadeep S Briston Lax, MD;  [facility];  Service: Cardiovascular;  Laterality: N/A;  . LIPOMA EXCISION  2003     Current Outpatient  Prescriptions  Medication Sig Dispense Refill  . amLODipine (NORVASC) 10 MG tablet TAKE 1 TABLET BY MOUTH DAILY 90 tablet 0  . aspirin 81 MG tablet Take 81 mg by mouth daily.    . carvedilol (COREG) 6.25 MG tablet TAKE 1 TABLET BY MOUTH TWICE DAILY 180 tablet 0  . losartan (COZAAR) 100 MG tablet TAKE 1 TABLET BY MOUTH DAILY 90 tablet 0  . metFORMIN (GLUCOPHAGE) 500 MG tablet Take 500 mg by mouth 2 (two) times daily with a meal.      No current facility-administered medications for this visit.     Allergies:   Penicillins    Social History:  The patient  reports that he has never smoked. He has never used smokeless tobacco. He reports that he does not drink alcohol or use drugs.   Family History:  The patient's family history includes Cancer in his father; Diabetes in his mother; Hyperlipidemia in his mother; Hypertension in his mother and sister; Kidney disease in his mother.    ROS:  Please see the history of present illness.   Otherwise, review of systems are positive for mild weight loss.   All other systems are reviewed and negative.    PHYSICAL EXAM: VS:  BP (!) 146/84   Pulse 70   Ht 5' 5.5" (1.664 m)   Wt 169 lb 12.8 oz (77 kg)   BMI 27.83 kg/m  , BMI Body mass index is  27.83 kg/m. GEN: Well nourished, well developed, in no acute distress  HEENT: normal  Neck: no JVD, carotid bruits, or masses Cardiac: RRR; no murmurs, rubs, or gallops,no edema  Respiratory:  clear to auscultation bilaterally, normal work of breathing GI: soft, nontender, nondistended, + BS MS: no deformity or atrophy ; lipoma at base of neck on back Skin: warm and dry, no rash Neuro:  Strength and sensation are intact Psych: euthymic mood, full affect   EKG:   The ekg ordered today demonstrates NSR, NSST, PVC, no change from 2016   Recent Labs: 06/16/2016: ALT 35; BUN 14; Creatinine, Ser 0.89; Hemoglobin 14.1; Platelets 174; Potassium 4.0; Sodium 135   Lipid Panel    Component Value  Date/Time   CHOL 151 12/08/2011 1000   TRIG 43 12/08/2011 1000   HDL 47 12/08/2011 1000   CHOLHDL 3.2 12/08/2011 1000   VLDL 9 12/08/2011 1000   LDLCALC 95 12/08/2011 1000     Other studies Reviewed: Additional studies/ records that were reviewed today with results demonstrating: prior cath report reviewed. 2016 echo showed LVH, normal LV function, no valve problems   ASSESSMENT AND PLAN:   1.  Apical hypertrophy/abnormal echo: No signs of diastolic CHF or fluid overload.   2.   HTN: COntrolled at home.  Systolics around 135 at home.  Labs checked in October.  Stable.   3.   DM: COntinue metformin.  He needs a PMD to follow this.  Check A1C 4.   Needs check of cholesterol when fasting.  Will arrange.     Current medicines are reviewed at length with the patient today.  The patient concerns regarding his medicines were addressed.  The following changes have been made:  No change  Labs/ tests ordered today include:  No orders of the defined types were placed in this encounter.   Recommend 150 minutes/week of aerobic exercise Low fat, low carb, high fiber diet recommended  Disposition:   FU in    Signed, Lance Muss, MD  10/06/2016 10:23 AM    [facility] [address] [place], [place], [place]  [address] Phone: [phone]; Fax: [phone]

## 2016-10-06 ENCOUNTER — Encounter: Payer: Self-pay | Admitting: Interventional Cardiology

## 2016-10-06 ENCOUNTER — Ambulatory Visit (INDEPENDENT_AMBULATORY_CARE_PROVIDER_SITE_OTHER): Payer: Medicare HMO | Admitting: Interventional Cardiology

## 2016-10-06 ENCOUNTER — Encounter (INDEPENDENT_AMBULATORY_CARE_PROVIDER_SITE_OTHER): Payer: Self-pay

## 2016-10-06 VITALS — BP 146/84 | HR 70 | Ht 65.5 in | Wt 169.8 lb

## 2016-10-06 DIAGNOSIS — R9430 Abnormal result of cardiovascular function study, unspecified: Secondary | ICD-10-CM | POA: Diagnosis not present

## 2016-10-06 DIAGNOSIS — Z13220 Encounter for screening for lipoid disorders: Secondary | ICD-10-CM | POA: Diagnosis not present

## 2016-10-06 DIAGNOSIS — E118 Type 2 diabetes mellitus with unspecified complications: Secondary | ICD-10-CM | POA: Diagnosis not present

## 2016-10-06 DIAGNOSIS — E119 Type 2 diabetes mellitus without complications: Secondary | ICD-10-CM | POA: Insufficient documentation

## 2016-10-06 DIAGNOSIS — I10 Essential (primary) hypertension: Secondary | ICD-10-CM | POA: Diagnosis not present

## 2016-10-06 DIAGNOSIS — I119 Hypertensive heart disease without heart failure: Secondary | ICD-10-CM

## 2016-10-06 MED ORDER — LOSARTAN POTASSIUM 100 MG PO TABS: 100.0000 mg | ORAL_TABLET | Freq: Every day | ORAL | 3 refills | Status: DC

## 2016-10-06 MED ORDER — CARVEDILOL 6.25 MG PO TABS: 6.2500 mg | ORAL_TABLET | Freq: Two times a day (BID) | ORAL | 3 refills | Status: DC

## 2016-10-06 MED ORDER — AMLODIPINE BESYLATE 10 MG PO TABS: 10.0000 mg | ORAL_TABLET | Freq: Every day | ORAL | 3 refills | Status: DC

## 2016-10-06 NOTE — Patient Instructions (Signed)
**Note De-Identified Sean Pittman Obfuscation** Medication Instructions:  Same-no changes  Labwork: Lipids and A1c on 10/11/16 anytime between 7:30 and 5:00. Please do not eat or drink after midnight the night before.  Testing/Procedures: None  Follow-Up: Your physician wants you to follow-up in: 1 year. You will receive a reminder letter in the mail two months in advance. If you don't receive a letter, please call our office to schedule the follow-up appointment.     If you need a refill on your cardiac medications before your next appointment, please call your pharmacy.

## 2016-10-11 ENCOUNTER — Other Ambulatory Visit: Payer: Medicare HMO | Admitting: *Deleted

## 2016-10-11 DIAGNOSIS — Z13220 Encounter for screening for lipoid disorders: Secondary | ICD-10-CM

## 2016-10-11 DIAGNOSIS — E118 Type 2 diabetes mellitus with unspecified complications: Secondary | ICD-10-CM | POA: Diagnosis not present

## 2016-10-12 LAB — LIPID PANEL
CHOLESTEROL TOTAL: 187 mg/dL (ref 100–199)
Chol/HDL Ratio: 5.5 ratio units — ABNORMAL HIGH (ref 0.0–5.0)
HDL: 34 mg/dL — AB (ref >39)
LDL Calculated: 135 mg/dL — ABNORMAL HIGH (ref 0–99)
TRIGLYCERIDES: 90 mg/dL (ref 0–149)
VLDL Cholesterol Cal: 18 mg/dL (ref 5–40)

## 2016-10-12 LAB — HEMOGLOBIN A1C
Est. average glucose Bld gHb Est-mCnc: 306 mg/dL
Hgb A1c MFr Bld: 12.3 % — ABNORMAL HIGH (ref 4.8–5.6)

## 2016-10-13 ENCOUNTER — Other Ambulatory Visit: Payer: Self-pay

## 2016-10-13 DIAGNOSIS — E782 Mixed hyperlipidemia: Secondary | ICD-10-CM

## 2016-10-13 MED ORDER — ATORVASTATIN CALCIUM 10 MG PO TABS: 10.0000 mg | ORAL_TABLET | Freq: Every day | ORAL | 3 refills | Status: DC

## 2016-11-22 ENCOUNTER — Other Ambulatory Visit: Payer: Medicare HMO

## 2016-11-24 ENCOUNTER — Other Ambulatory Visit: Payer: Medicare HMO | Admitting: *Deleted

## 2016-11-24 DIAGNOSIS — E782 Mixed hyperlipidemia: Secondary | ICD-10-CM

## 2016-11-24 LAB — HEPATIC FUNCTION PANEL
ALBUMIN: 4.4 g/dL (ref 3.6–4.8)
ALT: 35 IU/L (ref 0–44)
AST: 26 IU/L (ref 0–40)
Alkaline Phosphatase: 119 IU/L — ABNORMAL HIGH (ref 39–117)
BILIRUBIN TOTAL: 0.6 mg/dL (ref 0.0–1.2)
Bilirubin, Direct: 0.21 mg/dL (ref 0.00–0.40)
TOTAL PROTEIN: 7.5 g/dL (ref 6.0–8.5)

## 2016-11-24 LAB — LIPID PANEL
CHOL/HDL RATIO: 3.3 ratio (ref 0.0–5.0)
CHOLESTEROL TOTAL: 98 mg/dL — AB (ref 100–199)
HDL: 30 mg/dL — ABNORMAL LOW (ref >39)
LDL CALC: 54 mg/dL (ref 0–99)
Triglycerides: 69 mg/dL (ref 0–149)
VLDL Cholesterol Cal: 14 mg/dL (ref 5–40)

## 2016-11-28 ENCOUNTER — Other Ambulatory Visit: Payer: Self-pay

## 2016-11-28 DIAGNOSIS — Z79899 Other long term (current) drug therapy: Secondary | ICD-10-CM

## 2016-12-18 ENCOUNTER — Telehealth: Payer: Self-pay | Admitting: Interventional Cardiology

## 2016-12-18 NOTE — Telephone Encounter (Signed)
[facility] pharmacy requesting a refill on Metformin. Please advise.

## 2016-12-19 NOTE — Telephone Encounter (Signed)
Patient's PCP should manage and follow this.

## 2017-03-05 DIAGNOSIS — Z1211 Encounter for screening for malignant neoplasm of colon: Secondary | ICD-10-CM | POA: Diagnosis not present

## 2017-03-05 DIAGNOSIS — E1129 Type 2 diabetes mellitus with other diabetic kidney complication: Secondary | ICD-10-CM | POA: Diagnosis not present

## 2017-03-05 DIAGNOSIS — Z7982 Long term (current) use of aspirin: Secondary | ICD-10-CM | POA: Diagnosis not present

## 2017-03-05 DIAGNOSIS — Z7984 Long term (current) use of oral hypoglycemic drugs: Secondary | ICD-10-CM | POA: Diagnosis not present

## 2017-03-05 DIAGNOSIS — Z7689 Persons encountering health services in other specified circumstances: Secondary | ICD-10-CM | POA: Diagnosis not present

## 2017-03-05 DIAGNOSIS — Z1159 Encounter for screening for other viral diseases: Secondary | ICD-10-CM | POA: Diagnosis not present

## 2017-03-05 DIAGNOSIS — Z955 Presence of coronary angioplasty implant and graft: Secondary | ICD-10-CM | POA: Diagnosis not present

## 2017-03-05 DIAGNOSIS — E782 Mixed hyperlipidemia: Secondary | ICD-10-CM | POA: Diagnosis not present

## 2017-03-05 DIAGNOSIS — I252 Old myocardial infarction: Secondary | ICD-10-CM | POA: Diagnosis not present

## 2017-03-05 DIAGNOSIS — Z23 Encounter for immunization: Secondary | ICD-10-CM | POA: Diagnosis not present

## 2017-03-05 DIAGNOSIS — E119 Type 2 diabetes mellitus without complications: Secondary | ICD-10-CM | POA: Diagnosis not present

## 2017-03-05 DIAGNOSIS — I10 Essential (primary) hypertension: Secondary | ICD-10-CM | POA: Diagnosis not present

## 2017-03-05 DIAGNOSIS — Z125 Encounter for screening for malignant neoplasm of prostate: Secondary | ICD-10-CM | POA: Diagnosis not present

## 2017-03-12 DIAGNOSIS — Z7189 Other specified counseling: Secondary | ICD-10-CM | POA: Diagnosis not present

## 2017-03-12 DIAGNOSIS — E1129 Type 2 diabetes mellitus with other diabetic kidney complication: Secondary | ICD-10-CM | POA: Diagnosis not present

## 2017-03-12 DIAGNOSIS — E782 Mixed hyperlipidemia: Secondary | ICD-10-CM | POA: Diagnosis not present

## 2017-03-12 DIAGNOSIS — I10 Essential (primary) hypertension: Secondary | ICD-10-CM | POA: Diagnosis not present

## 2017-03-12 DIAGNOSIS — E118 Type 2 diabetes mellitus with unspecified complications: Secondary | ICD-10-CM | POA: Diagnosis not present

## 2017-03-12 DIAGNOSIS — R809 Proteinuria, unspecified: Secondary | ICD-10-CM | POA: Diagnosis not present

## 2017-03-15 DIAGNOSIS — R69 Illness, unspecified: Secondary | ICD-10-CM | POA: Diagnosis not present

## 2017-04-12 DIAGNOSIS — E118 Type 2 diabetes mellitus with unspecified complications: Secondary | ICD-10-CM | POA: Diagnosis not present

## 2017-04-12 DIAGNOSIS — Z1211 Encounter for screening for malignant neoplasm of colon: Secondary | ICD-10-CM | POA: Diagnosis not present

## 2017-04-12 DIAGNOSIS — I10 Essential (primary) hypertension: Secondary | ICD-10-CM | POA: Diagnosis not present

## 2017-04-12 DIAGNOSIS — E782 Mixed hyperlipidemia: Secondary | ICD-10-CM | POA: Diagnosis not present

## 2017-04-12 DIAGNOSIS — S76112A Strain of left quadriceps muscle, fascia and tendon, initial encounter: Secondary | ICD-10-CM | POA: Diagnosis not present

## 2017-05-13 IMAGING — CR DG LUMBAR SPINE COMPLETE 4+V
5 series · 5 of 5 positions shown · non-contrast
Comparison: Radiographs 11/29/2011

CLINICAL DATA: Lumbosacral back pain for 2 weeks.  No known injury.

EXAM:
LUMBAR SPINE - COMPLETE 4+ VIEW

[t lumbar spine ap]
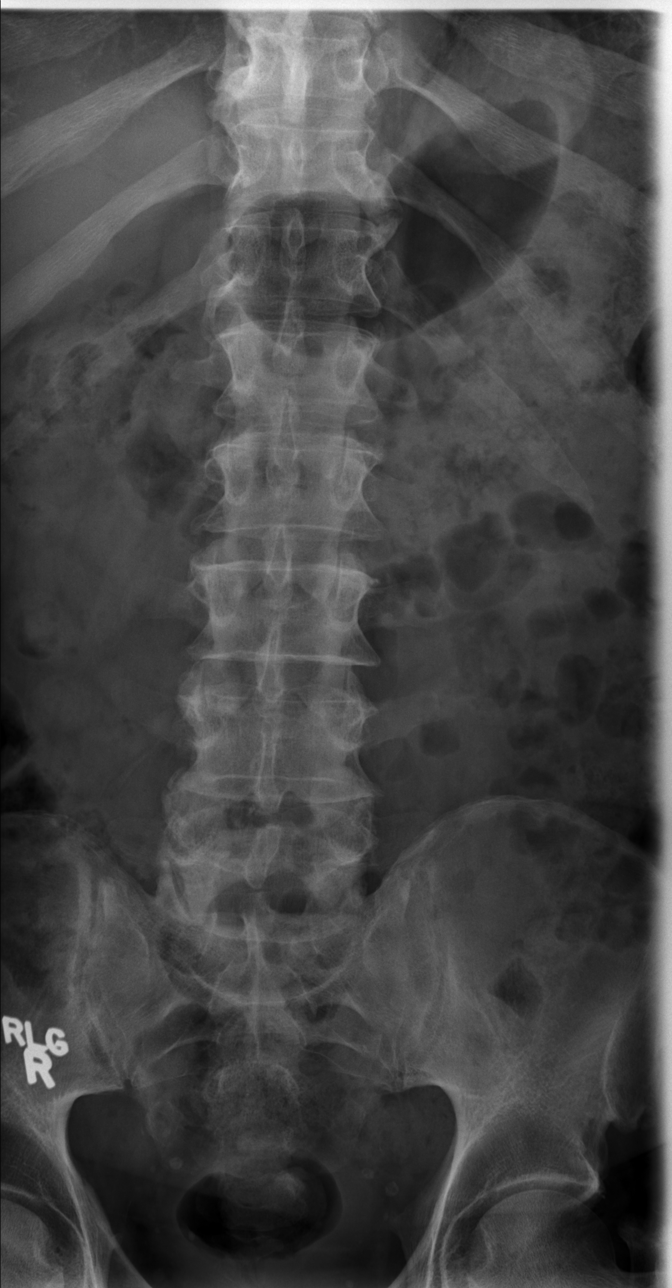

[t lumbar spine obl (1 of 2)]
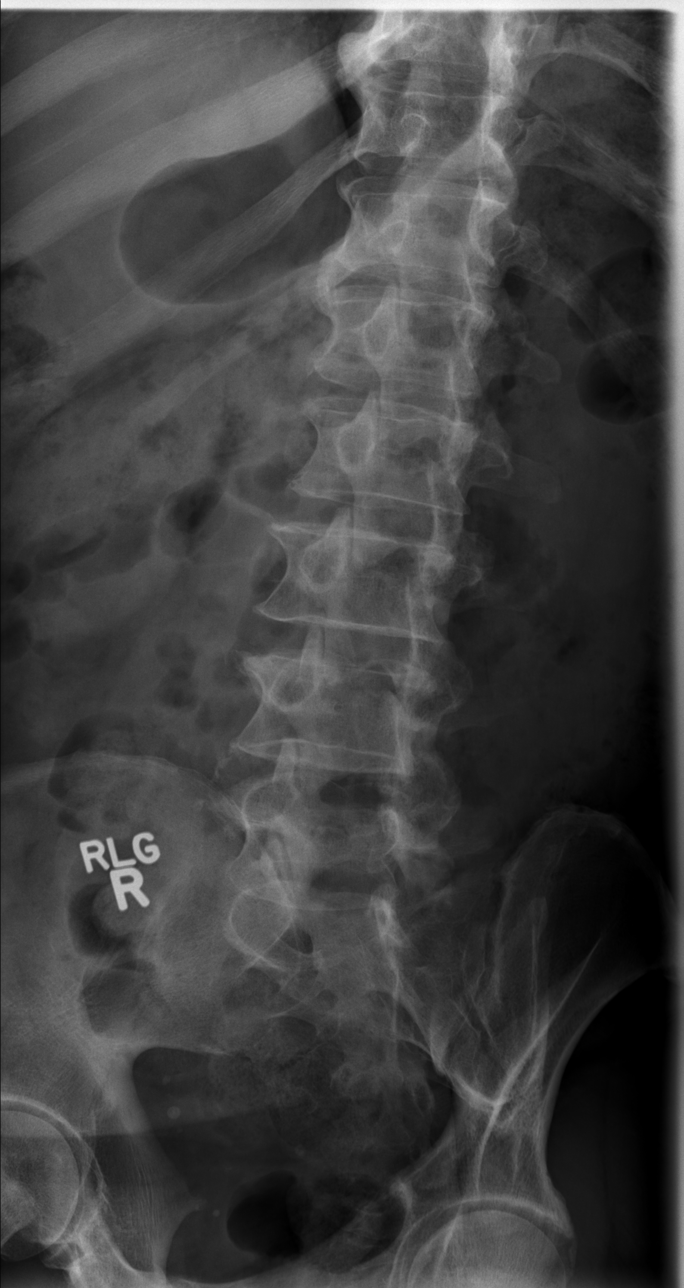

[t lumbar spine obl (2 of 2)]
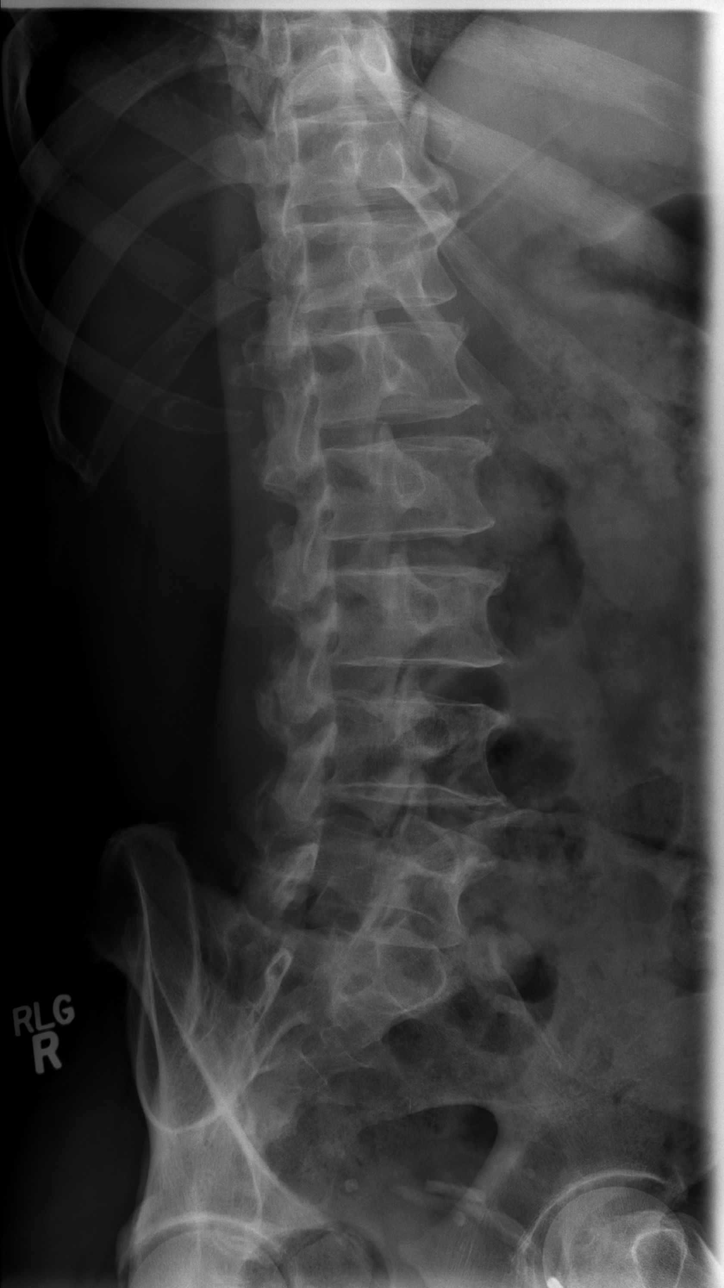

[t lumbar spine lat]
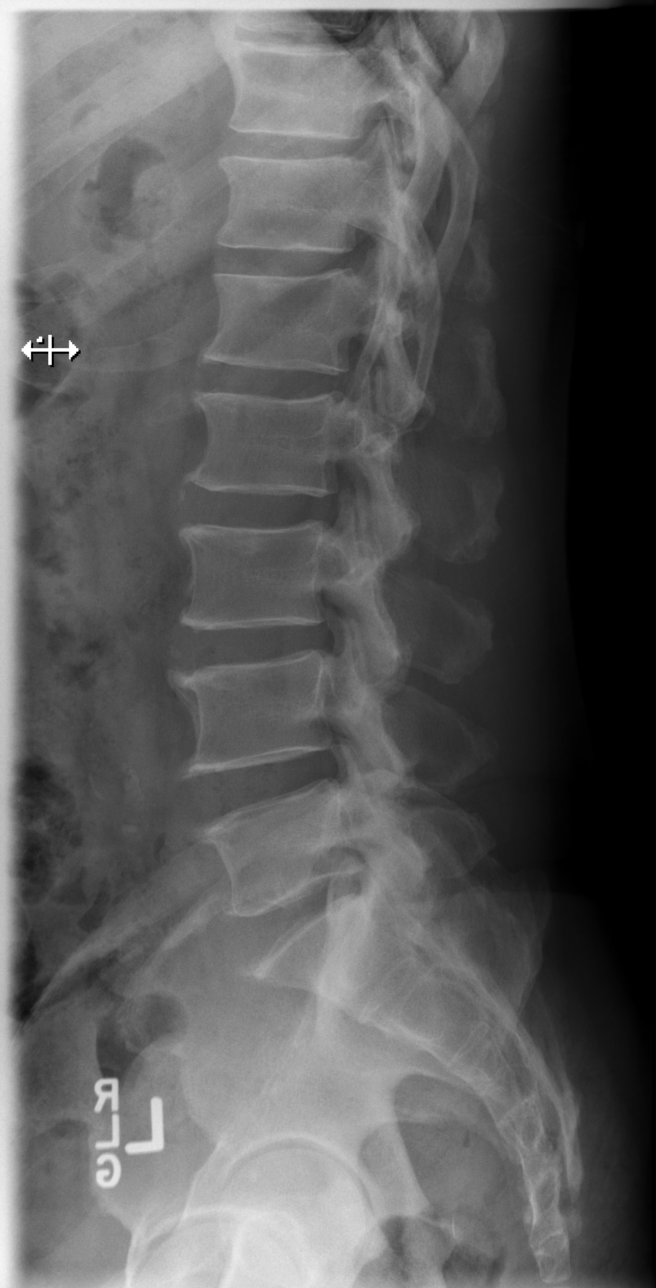

[t lumbar l-5 s-1 spot]
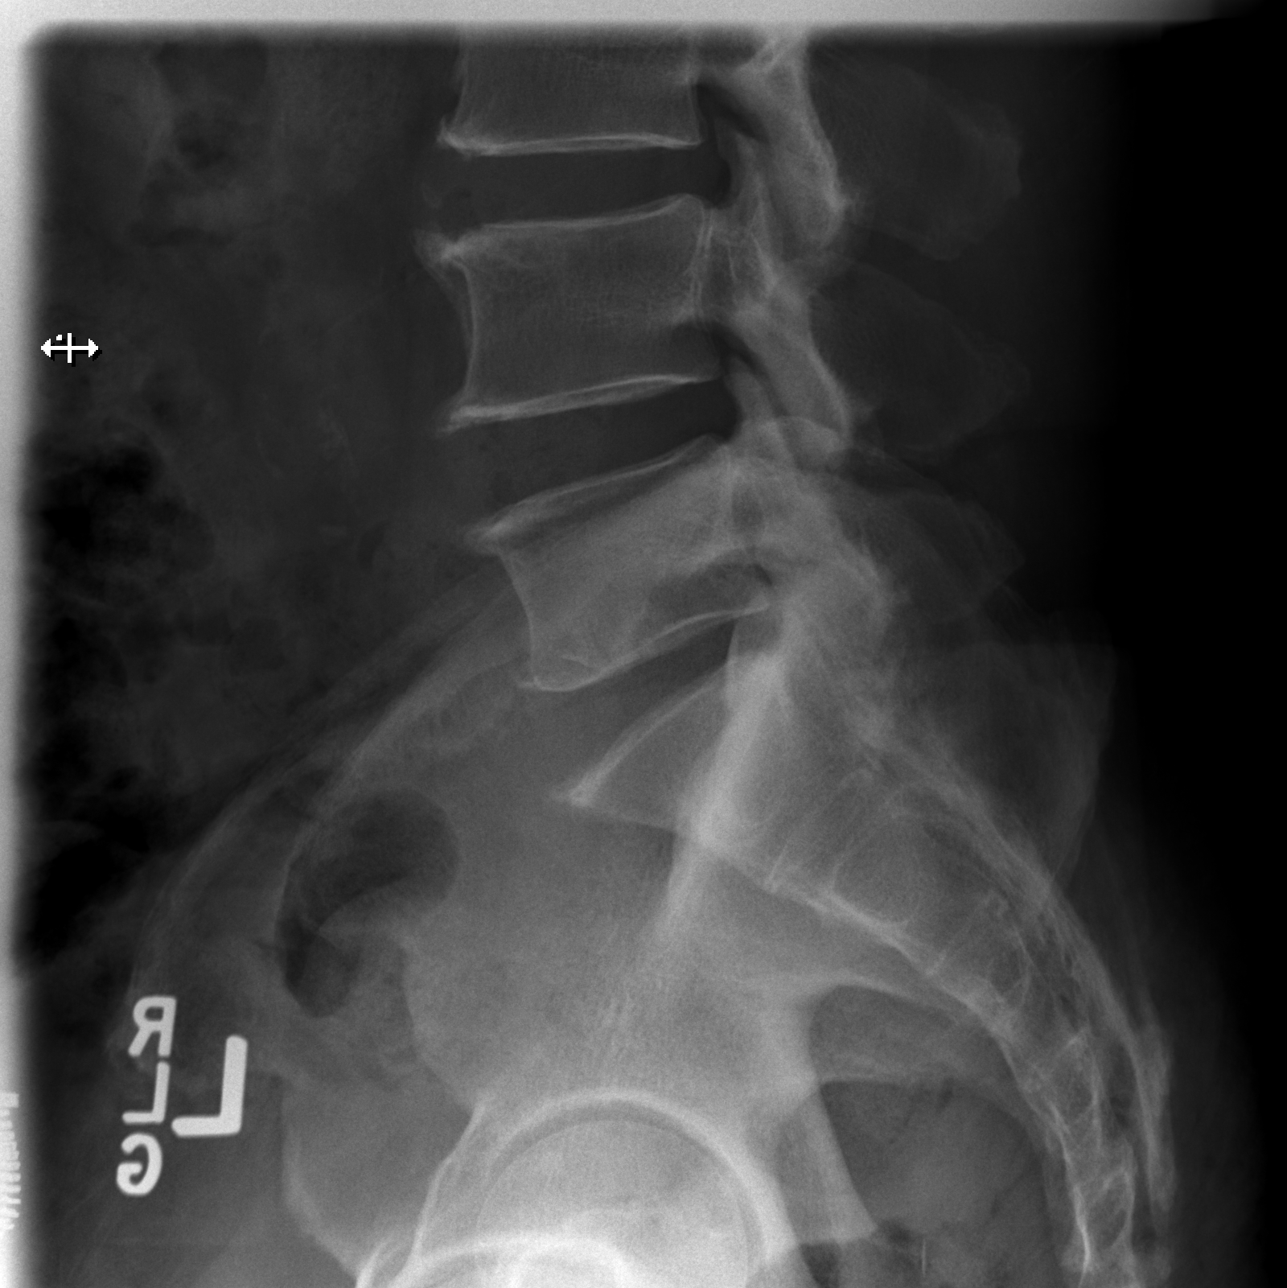

[5 of 5 positions shown; findings below may reference images not displayed]

FINDINGS: Straightening of normal lordosis, unchanged. No listhesis. Vertebral
body heights are maintained. Mild endplate spurring with
preservation of disc space. Mild facet arthropathy at L5-S1. No bony
destructive change. Sacroiliac joints are congruent.
IMPRESSION: Stable mild spondylosis without acute osseous abnormality.

## 2017-05-31 ENCOUNTER — Other Ambulatory Visit: Payer: Medicare HMO

## 2017-06-05 DIAGNOSIS — R69 Illness, unspecified: Secondary | ICD-10-CM | POA: Diagnosis not present

## 2017-06-29 ENCOUNTER — Emergency Department (HOSPITAL_COMMUNITY): Payer: Medicare HMO

## 2017-06-29 ENCOUNTER — Emergency Department (HOSPITAL_COMMUNITY)
Admission: EM | Admit: 2017-06-29 | Discharge: 2017-06-29 | Disposition: A | Discharge status: Home / Self Care | Payer: Medicare HMO | Attending: Emergency Medicine | Admitting: Emergency Medicine

## 2017-06-29 ENCOUNTER — Telehealth: Payer: Self-pay | Admitting: Interventional Cardiology

## 2017-06-29 ENCOUNTER — Encounter (HOSPITAL_COMMUNITY): Payer: Self-pay | Admitting: *Deleted

## 2017-06-29 DIAGNOSIS — R079 Chest pain, unspecified: Secondary | ICD-10-CM | POA: Diagnosis not present

## 2017-06-29 DIAGNOSIS — E119 Type 2 diabetes mellitus without complications: Secondary | ICD-10-CM | POA: Diagnosis not present

## 2017-06-29 DIAGNOSIS — R0789 Other chest pain: Secondary | ICD-10-CM | POA: Insufficient documentation

## 2017-06-29 DIAGNOSIS — Z7982 Long term (current) use of aspirin: Secondary | ICD-10-CM | POA: Diagnosis not present

## 2017-06-29 DIAGNOSIS — Z79899 Other long term (current) drug therapy: Secondary | ICD-10-CM | POA: Insufficient documentation

## 2017-06-29 DIAGNOSIS — I10 Essential (primary) hypertension: Secondary | ICD-10-CM | POA: Diagnosis not present

## 2017-06-29 DIAGNOSIS — Z7984 Long term (current) use of oral hypoglycemic drugs: Secondary | ICD-10-CM | POA: Diagnosis not present

## 2017-06-29 LAB — BASIC METABOLIC PANEL
ANION GAP: 10 (ref 5–15)
BUN: 14 mg/dL (ref 6–20)
CO2: 26 mmol/L (ref 22–32)
Calcium: 9.2 mg/dL (ref 8.9–10.3)
Chloride: 103 mmol/L (ref 101–111)
Creatinine, Ser: 1.13 mg/dL (ref 0.61–1.24)
GFR calc non Af Amer: >60 mL/min (ref >60)
Glucose, Bld: 107 mg/dL — ABNORMAL HIGH (ref 65–99)
POTASSIUM: 3.9 mmol/L (ref 3.5–5.1)
Sodium: 139 mmol/L (ref 135–145)

## 2017-06-29 LAB — CBC
HCT: 39 % (ref 39.0–52.0)
HEMOGLOBIN: 13.6 g/dL (ref 13.0–17.0)
MCH: 29.6 pg (ref 26.0–34.0)
MCHC: 34.9 g/dL (ref 30.0–36.0)
MCV: 84.8 fL (ref 78.0–100.0)
Platelets: 267 10*3/uL (ref 150–400)
RBC: 4.6 MIL/uL (ref 4.22–5.81)
RDW: 13.7 % (ref 11.5–15.5)
WBC: 11.4 10*3/uL — ABNORMAL HIGH (ref 4.0–10.5)

## 2017-06-29 LAB — I-STAT TROPONIN, ED
Troponin i, poc: 0 ng/mL (ref 0.00–0.08)
Troponin i, poc: 0 ng/mL (ref 0.00–0.08)

## 2017-06-29 MED ORDER — KETOROLAC TROMETHAMINE 15 MG/ML IJ SOLN
15.0000 mg | Freq: Once | INTRAMUSCULAR | Status: AC
  Administered 2017-06-29: 15 mg via INTRAVENOUS
  Filled 2017-06-29: qty 1

## 2017-06-29 MED ORDER — GI COCKTAIL ~~LOC~~
30.0000 mL | Freq: Once | ORAL | Status: AC
  Administered 2017-06-29: 30 mL via ORAL
  Filled 2017-06-29: qty 30

## 2017-06-29 NOTE — ED Triage Notes (Signed)
Pt repors central chest pain starting yesterday. Pt states that he thought it was heartburn. Pt states that it has continued

## 2017-06-29 NOTE — ED Provider Notes (Signed)
[facility] EMERGENCY DEPARTMENT Provider Note  CSN: [id number] Arrival date & time: 06/29/17 1005  Chief Complaint(s) Chest Pain  HPI Sean Pittman is a 66 y.o. male   The history is provided by the patient.  Chest Pain   This is a recurrent problem. The current episode started yesterday (20 hrs ago). Episode frequency: intermittent. Progression since onset: fluctuating. The pain is present in the lateral region (pin point to left parasternal area). The quality of the pain is described as brief and sharp. The pain does not radiate. Duration of episode(s) is 10 seconds. The symptoms are aggravated by certain positions. Pertinent negatives include no back pain, no cough, no fever, no leg pain, no lower extremity edema, no malaise/fatigue, no nausea, no shortness of breath and no vomiting. Risk factors include male gender.  His past medical history is significant for CAD, diabetes, hyperlipidemia, hypertension and MI (s/p cath but to angioplasty or stents).  Pertinent negatives for past medical history include no cancer, no DVT, no PE and no strokes.  Procedure history is positive for cardiac catheterization.    Past Medical History Past Medical History:  Diagnosis Date  . Apical variant hypertrophic cardiomyopathy (HCC)   . History of echocardiogram    Echo 11/16: Moderate LVH, vigorous LVF, EF 55-60%, normal wall motion, grade 1 diastolic dysfunction  . Hypertension   . S/P cardiac catheterization    LHC 4/13: no sig CAD, EF 60%. LVEDP was 30 mmHg. Evidence of apical hypertrophy.   Patient Active Problem List   Diagnosis Date Noted  . Type 2 diabetes mellitus without complications (HCC) 10/06/2016  . LVH (left ventricular hypertrophy) due to hypertensive disease, without heart failure 10/06/2016  . Nonspecific abnormal results of cardiovascular function study 03/16/2014  . Nonspecific abnormal electrocardiogram (ECG) (EKG) 03/16/2014  . Essential hypertension,  benign 03/16/2014   Home Medication(s) Prior to Admission medications   Medication Sig Start Date End Date Taking? Authorizing Provider  amLODipine (NORVASC) 10 MG tablet Take 1 tablet (10 mg total) by mouth daily. 10/06/16  Yes Corky Crafts, MD  aspirin 81 MG tablet Take 81 mg by mouth daily.   Yes [provider]  Aspirin-Calcium Carbonate 81-777 MG TABS Take 81 mg by mouth daily.   Yes [provider]  atorvastatin (LIPITOR) 10 MG tablet Take 1 tablet (10 mg total) by mouth daily. 10/13/16 06/29/17 Yes Corky Crafts, MD  carvedilol (COREG) 6.25 MG tablet Take 1 tablet (6.25 mg total) by mouth 2 (two) times daily. 10/06/16  Yes Corky Crafts, MD  losartan (COZAAR) 100 MG tablet Take 1 tablet (100 mg total) by mouth daily. 10/06/16  Yes Corky Crafts, MD  metFORMIN (GLUCOPHAGE) 500 MG tablet Take 1,000 mg by mouth 2 (two) times daily with a meal.  02/14/14  Yes [provider]  Past Surgical History Past Surgical History:  Procedure Laterality Date  . CORONARY ANGIOPLASTY    . LEFT HEART CATHETERIZATION WITH CORONARY ANGIOGRAM N/A 12/08/2011   Procedure: LEFT HEART CATHETERIZATION WITH CORONARY ANGIOGRAM;  Surgeon: Corky Crafts, MD;  Location: [facility] CATH LAB;  Service: Cardiovascular;  Laterality: N/A;  . LIPOMA EXCISION  2003   Family History Family History  Problem Relation Age of Onset  . Hypertension Mother   . Diabetes Mother   . Hyperlipidemia Mother   . Kidney disease Mother   . Cancer Father   . Hypertension Sister     Social History Social History  Substance Use Topics  . Smoking status: Never Smoker  . Smokeless tobacco: Never Used  . Alcohol use No   Allergies Penicillins  Review of Systems Review of Systems  Constitutional: Negative for fever and malaise/fatigue.  Respiratory:  Negative for cough and shortness of breath.   Cardiovascular: Positive for chest pain.  Gastrointestinal: Negative for nausea and vomiting.  Musculoskeletal: Negative for back pain.   All other systems are reviewed and are negative for acute change except as noted in the HPI  Physical Exam Vital Signs  I have reviewed the triage vital signs BP 125/85 (BP Location: Right Arm)   Pulse 76   Temp 98.6 F (37 C) (Oral)   SpO2 98%   Physical Exam  Constitutional: He is oriented to person, place, and time. He appears well-developed and well-nourished. No distress.  HENT:  Head: Normocephalic and atraumatic.  Nose: Nose normal.  Eyes: Pupils are equal, round, and reactive to light. Conjunctivae and EOM are normal. Right eye exhibits no discharge. Left eye exhibits no discharge. No scleral icterus.  Neck: Normal range of motion. Neck supple.  Cardiovascular: Normal rate and regular rhythm.  Exam reveals no gallop and no friction rub.   No murmur heard. Pulmonary/Chest: Effort normal and breath sounds normal. No stridor. No respiratory distress. He has no rales. He exhibits tenderness.    Abdominal: Soft. He exhibits no distension. There is no tenderness.  Musculoskeletal: He exhibits no edema or tenderness.  Neurological: He is alert and oriented to person, place, and time.  Skin: Skin is warm and dry. No rash noted. He is not diaphoretic. No erythema.  Psychiatric: He has a normal mood and affect.  Vitals reviewed.   ED Results and Treatments Labs (all labs ordered are listed, but only abnormal results are displayed) Labs Reviewed  BASIC METABOLIC PANEL - Abnormal; Notable for the following:       Result Value   Glucose, Bld 107 (*)    All other components within normal limits  CBC - Abnormal; Notable for the following:    WBC 11.4 (*)    All other components within normal limits  I-STAT TROPONIN, ED  I-STAT TROPONIN, ED  EKG  EKG Interpretation  Date/Time:  Friday June 29 2017 10:13:52 EDT Ventricular Rate:  75 PR Interval:  150 QRS Duration: 76 QT Interval:  384 QTC Calculation: 428 R Axis:   9 Text Interpretation:  Normal sinus rhythm Septal infarct , age undetermined Abnormal ECG Nonspecific T wave abnormality, improved in Otherwise no significant change Confirmed by Drema Pry [phone]) on 06/29/2017 11:05:37 AM      Radiology Dg Chest 2 View  Result Date: 06/29/2017 CLINICAL DATA:  Intermittent upper left chest pain beginning yesterday. EXAM: CHEST  2 VIEW COMPARISON:  None. FINDINGS: Lungs are clear. Heart size is normal. No pneumothorax or pleural fluid. No acute bony abnormality. IMPRESSION: Negative chest. Electronically Signed   By: Drusilla Kanner M.D.   On: 06/29/2017 11:01   Pertinent labs & imaging results that were available during my care of the patient were reviewed by me and considered in my medical decision making (see chart for details).  Medications Ordered in ED Medications  gi cocktail (Maalox,Lidocaine,Donnatal) (30 mLs Oral Given 06/29/17 1217)  ketorolac (TORADOL) 15 MG/ML injection 15 mg (15 mg Intravenous Given 06/29/17 1223)                                                                                                                                    Procedures Procedures  (including critical care time)  Medical Decision Making / ED Course I have reviewed the nursing notes for this encounter and the patient's prior records (if available in EHR or on provided paperwork).  Clinical Course as of Jun 30 1551  Fri Jun 29, 2017  1220 Atypical chest pain highly consistent with ACS.  EKG without acute ischemic changes or evidence of pericarditis.  Initial troponin negative.  Patient is high risk for ACS however he has had a clean catheterization in 2014.  Will discuss case with cardiology regarding  delta troponin versus admission for ACS rule out.  Presentation not classic for aortic dissection or esophageal perforation. Chest x-ray without evidence suggestive of pneumonia, pneumothorax, pneumomediastinum.  No abnormal contour of the mediastinum to suggest dissection. No evidence of acute injuries.  Low pretest probability for pulmonary embolism.  Given the pinpoint tenderness, possibly intercostal/pectoralis muscle spasm versus costochondral strain. We will provide the patient with Toradol.  We will also provide the patient with GI cocktail for possible gastritis versus esophagitis.  [PC]  1550 Delta troponin negative.  [PC]    Clinical Course User Index [PC] Shanquita Ronning, Amadeo Garnet, MD   The patient is safe for discharge with strict return precautions.   Final Clinical Impression(s) / ED Diagnoses Final diagnoses:  Atypical chest pain    Disposition: Discharge  Condition: Good  I have discussed the results, Dx and Tx plan with the patient who expressed understanding and agree(s) with the plan. Discharge instructions discussed at great length. The patient was given strict return precautions who verbalized understanding of the instructions.  No further questions at time of discharge.    New Prescriptions   No medications on file    Follow Up: Corky CraftsVaranasi, Jayadeep S, MD [address] [place] [address] [phone]   in 3-5 days     This chart was dictated using voice recognition software.  Despite best efforts to proofread,  errors can occur which can change the documentation meaning.   Nira Connardama, Jayleana Colberg Eduardo, MD 06/29/17 [phone]

## 2017-06-29 NOTE — Telephone Encounter (Signed)
New message  Pt c/o of Chest Pain: STAT if CP now or developed within 24 hours  1. Are you having CP right now? yes  2. Are you experiencing any other symptoms (ex. SOB, nausea, vomiting, sweating)?   3. How long have you been experiencing CP? yesterday  4. Is your CP continuous or coming and going? Coming and going but continuous now   5. Have you taken Nitroglycerin?  ?

## 2017-06-29 NOTE — ED Notes (Signed)
Attempted blood draw x2 unsuccessful 

## 2017-06-29 NOTE — Telephone Encounter (Signed)
Patient called in stating he is having chest pain that comes and goes. Patient describes the pain is mid chest and a throbbing feeling, non radiating, no SOB, no dizziness, no lightheadness. Pain started around 6pm yesterday 06/28/17 afternoon after eating supper. Patient stated he had hamburger helper and beans. Patient rates pain 3 or 4/10, does not get worse with movement. Patient had not taken his vital signs. Patient does not have nitroglycerin to take, and is having chest pain now. No recent stress or cath on file. Last cath was 2013. Informed patient if the pain continues he should go to the emergency room to be evaluated. Patient verbalized understanding.

## 2017-07-04 NOTE — Telephone Encounter (Signed)
Called to follow up with patient after being seen in the ED for chest pain. Patient states that he is feeling better and does not have any complaints. Patient was advised to follow up with cardiology. Scheduled patient to see Dr. Eldridge DaceVaranasi on 11/9 at 11:40 AM.

## 2017-07-13 ENCOUNTER — Encounter: Payer: Self-pay | Admitting: Interventional Cardiology

## 2017-07-13 ENCOUNTER — Ambulatory Visit: Payer: Medicare HMO | Admitting: Interventional Cardiology

## 2017-07-13 VITALS — BP 138/86 | HR 87 | Ht 65.5 in | Wt 165.0 lb

## 2017-07-13 DIAGNOSIS — I10 Essential (primary) hypertension: Secondary | ICD-10-CM | POA: Diagnosis not present

## 2017-07-13 DIAGNOSIS — I422 Other hypertrophic cardiomyopathy: Secondary | ICD-10-CM

## 2017-07-13 DIAGNOSIS — E119 Type 2 diabetes mellitus without complications: Secondary | ICD-10-CM | POA: Diagnosis not present

## 2017-07-13 DIAGNOSIS — R0789 Other chest pain: Secondary | ICD-10-CM

## 2017-07-13 NOTE — Progress Notes (Signed)
Cardiology Office Note   Date:  07/13/2017   ID:  Sean SpireMichael Bonawitz, DOB 11/08/1950, MRN [id number]  PCP:  [facility], [facility]    No chief complaint on file. Apical hypertrophy, CP   Wt Readings from Last 3 Encounters:  07/13/17 165 lb (74.8 kg)  10/06/16 169 lb 12.8 oz (77 kg)  06/09/15 171 lb 6.4 oz (77.7 kg)       History of Present Illness: Sean Pittman is a 66 y.o. male  who has apical hypertrophy. He has had HTN as well. ECG is abnormal and he had a cath for concerning ECG in 12/2011 showing:  1. Normal left main coronary artery. 2. No significant atherosclerosis in the left anterior descending artery and its branches. 3. Normal left circumflex artery and its branches. 4. Normal right coronary artery. Normal left ventricular systolic function. LVEDP 30 mmHg. Ejection fraction 60%.  He had an episode of chest pain after eating something spicy.  He had  anegative w/u in the ER. No further sx since that time.   He walks regularly and no chest pain with that activity.    Since ER visit: Denies : Chest pain. Dizziness. Leg edema. Nitroglycerin use. Orthopnea. Palpitations. Paroxysmal nocturnal dyspnea. Shortness of breath. Syncope.   A1C has been high.  Sugars recently have been much better, average 115.      Past Medical History:  Diagnosis Date  . Apical variant hypertrophic cardiomyopathy (HCC)   . History of echocardiogram    Echo 11/16: Moderate LVH, vigorous LVF, EF 55-60%, normal wall motion, grade 1 diastolic dysfunction  . Hypertension   . S/P cardiac catheterization    LHC 4/13: no sig CAD, EF 60%. LVEDP was 30 mmHg. Evidence of apical hypertrophy.    Past Surgical History:  Procedure Laterality Date  . CORONARY ANGIOPLASTY    . LIPOMA EXCISION  2003     Current Outpatient Medications  Medication Sig Dispense Refill  . amLODipine (NORVASC) 10 MG tablet Take 1 tablet (10 mg total) by mouth daily. 90 tablet 3  . aspirin 81 MG tablet Take  81 mg by mouth daily.    . Aspirin-Calcium Carbonate 81-777 MG TABS Take 81 mg by mouth daily.    Marland Kitchen. atorvastatin (LIPITOR) 40 MG tablet Take 40 mg daily by mouth.    . carvedilol (COREG) 6.25 MG tablet Take 1 tablet (6.25 mg total) by mouth 2 (two) times daily. 180 tablet 3  . diclofenac sodium (VOLTAREN) 1 % GEL Apply 1 application as needed topically.    Marland Kitchen. ibuprofen (ADVIL,MOTRIN) 800 MG tablet Take 800 mg every 8 (eight) hours as needed by mouth.    . losartan (COZAAR) 100 MG tablet Take 1 tablet (100 mg total) by mouth daily. 90 tablet 3  . metFORMIN (GLUCOPHAGE) 500 MG tablet Take 1,000 mg by mouth 2 (two) times daily with a meal.      No current facility-administered medications for this visit.     Allergies:   Penicillins    Social History:  The patient  reports that  has never smoked. he has never used smokeless tobacco. He reports that he does not drink alcohol or use drugs.   Family History:  The patient's family history includes Cancer in his father; Diabetes in his mother; Hyperlipidemia in his mother; Hypertension in his mother and sister; Kidney disease in his mother.    ROS:  Please see the history of present illness.   Otherwise, review of systems are positive for  one episode of chest pain.   All other systems are reviewed and negative.    PHYSICAL EXAM: VS:  BP 138/86   Pulse 87   Ht 5' 5.5" (1.664 m)   Wt 165 lb (74.8 kg)   SpO2 98%   BMI 27.04 kg/m  , BMI Body mass index is 27.04 kg/m. GEN: Well nourished, well developed, in no acute distress  HEENT: normal  Neck: no JVD, carotid bruits, or masses Cardiac: RRR; no murmurs, rubs, or gallops,no edema  Respiratory:  clear to auscultation bilaterally, normal work of breathing GI: soft, nontender, nondistended, + BS MS: no deformity or atrophy  Skin: warm and dry, no rash Neuro:  Strength and sensation are intact Psych: euthymic mood, full affect   EKG:   The ekg ordered 06/29/17 demonstrates NSR, LVH, with  some repol changes   Recent Labs: 11/24/2016: ALT 35 06/29/2017: BUN 14; Creatinine, Ser 1.13; Hemoglobin 13.6; Platelets 267; Potassium 3.9; Sodium 139   Lipid Panel    Component Value Date/Time   CHOL 98 (L) 11/24/2016 0740   TRIG 69 11/24/2016 0740   HDL 30 (L) 11/24/2016 0740   CHOLHDL 3.3 11/24/2016 0740   CHOLHDL 3.2 12/08/2011 1000   VLDL 9 12/08/2011 1000   LDLCALC 54 11/24/2016 0740     Other studies Reviewed: Additional studies/ records that were reviewed today with results demonstrating: cath results reviewed.  Lipids reviewed.   ASSESSMENT AND PLAN:  1. Chest pain:  THis was quite atypical.  No exertional symptoms.  Continue preventive therapy.  If he has further discomfort, we will reconsider further testing. 2. Apical hypertrophy/LVH: He appears euvolemic.  Hypertension appears well controlled. 3. Diabetes: A1c was high as noted above.  He states that his fingerstick readings have been much better.  Continue dietary measures and increased exercise to help keep blood sugars down.   Current medicines are reviewed at length with the patient today.  The patient concerns regarding his medicines were addressed.  The following changes have been made:  No change  Labs/ tests ordered today include:  No orders of the defined types were placed in this encounter.   Recommend 150 minutes/week of aerobic exercise Low fat, low carb, high fiber diet recommended  Disposition:   FU in 1 year   Signed, Lance MussJayadeep Rebel Laughridge, MD  07/13/2017 12:01 PM    [facility] [address], [place], [place]  [address] Phone: [phone]; Fax: [phone]

## 2017-07-13 NOTE — Patient Instructions (Signed)

## 2017-08-02 ENCOUNTER — Other Ambulatory Visit: Payer: Medicare HMO

## 2017-08-16 ENCOUNTER — Other Ambulatory Visit: Payer: Self-pay | Admitting: Interventional Cardiology

## 2017-08-16 MED ORDER — LOSARTAN POTASSIUM 100 MG PO TABS: 100.0000 mg | ORAL_TABLET | Freq: Every day | ORAL | 3 refills | Status: DC

## 2017-08-16 MED ORDER — CARVEDILOL 6.25 MG PO TABS: 6.2500 mg | ORAL_TABLET | Freq: Two times a day (BID) | ORAL | 3 refills | Status: DC

## 2017-08-16 MED ORDER — AMLODIPINE BESYLATE 10 MG PO TABS: 10.0000 mg | ORAL_TABLET | Freq: Every day | ORAL | 3 refills | Status: DC

## 2017-08-16 NOTE — Telephone Encounter (Signed)
Pt's medications were sent to pt's pharmacy as requested. Confirmation received.  

## 2017-08-23 DIAGNOSIS — E119 Type 2 diabetes mellitus without complications: Secondary | ICD-10-CM | POA: Diagnosis not present

## 2017-09-01 DIAGNOSIS — R69 Illness, unspecified: Secondary | ICD-10-CM | POA: Diagnosis not present

## 2018-07-19 NOTE — Progress Notes (Deleted)
Cardiology Office Note    Date:  07/19/2018   ID:  Sean Pittman, DOB 11/28/1950, MRN [id number]  PCP:  Clinic, [facility]  Cardiologist: Dr. Eldridge DaceVaranasi   Chief Complaint: 12  Months follow up  History of Present Illness:   Sean Pittman is a 67 y.o. male with hx of apical hypertrophy, DM and HTN presents for follow wp.  Cath in 2013 showed normal coronaries as below: 1. Normal left main coronary artery. 2. No significant atherosclerosis in the left anterior descending artery and its branches. 3. Normal left circumflex artery and its branches. 4. Normal right coronary artery. Normal left ventricular systolic function. LVEDP 30 mmHg. Ejection fraction 60%  He was doing well on cardiac stand point when last seen by Dr. Eldridge DaceVaranasi 07/2017.  Past Medical History:  Diagnosis Date  . Apical variant hypertrophic cardiomyopathy (HCC)   . History of echocardiogram    Echo 11/16: Moderate LVH, vigorous LVF, EF 55-60%, normal wall motion, grade 1 diastolic dysfunction  . Hypertension   . S/P cardiac catheterization    LHC 4/13: no sig CAD, EF 60%. LVEDP was 30 mmHg. Evidence of apical hypertrophy.    Past Surgical History:  Procedure Laterality Date  . CORONARY ANGIOPLASTY    . LEFT HEART CATHETERIZATION WITH CORONARY ANGIOGRAM N/A 12/08/2011   Procedure: LEFT HEART CATHETERIZATION WITH CORONARY ANGIOGRAM;  Surgeon: Corky CraftsJayadeep S Varanasi, MD;  [facility];  Service: Cardiovascular;  Laterality: N/A;  . LIPOMA EXCISION  2003    Current Medications: Prior to Admission medications   Medication Sig Start Date End Date Taking? Authorizing Provider  amLODipine (NORVASC) 10 MG tablet Take 1 tablet (10 mg total) by mouth daily. 08/16/17   Corky CraftsVaranasi, Jayadeep S, MD  aspirin 81 MG tablet Take 81 mg by mouth daily.    [provider]  Aspirin-Calcium Carbonate 81-777 MG TABS Take 81 mg by mouth daily.    [provider]  atorvastatin (LIPITOR) 40 MG tablet  Take 40 mg daily by mouth. 03/12/17   [provider]  carvedilol (COREG) 6.25 MG tablet Take 1 tablet (6.25 mg total) by mouth 2 (two) times daily. 08/16/17   Corky CraftsVaranasi, Jayadeep S, MD  diclofenac sodium (VOLTAREN) 1 % GEL Apply 1 application as needed topically. 04/12/17   [provider]  ibuprofen (ADVIL,MOTRIN) 800 MG tablet Take 800 mg every 8 (eight) hours as needed by mouth. 01/18/16   [provider]  losartan (COZAAR) 100 MG tablet Take 1 tablet (100 mg total) by mouth daily. 08/16/17   Corky CraftsVaranasi, Jayadeep S, MD  metFORMIN (GLUCOPHAGE) 500 MG tablet Take 1,000 mg by mouth 2 (two) times daily with a meal.  02/14/14   [provider]    Allergies:   Penicillins   Social History   Socioeconomic History  . Marital status: Married    Spouse name: Not on file  . Number of children: Not on file  . Years of education: Not on file  . Highest education level: Not on file  Occupational History  . Not on file  Social Needs  . Financial resource strain: Not on file  . Food insecurity:    Worry: Not on file    Inability: Not on file  . Transportation needs:    Medical: Not on file    Non-medical: Not on file  Tobacco Use  . Smoking status: Never Smoker  . Smokeless tobacco: Never Used  Substance and Sexual Activity  . Alcohol use: No  .  Drug use: No  . Sexual activity: Not on file  Lifestyle  . Physical activity:    Days per week: Not on file    Minutes per session: Not on file  . Stress: Not on file  Relationships  . Social connections:    Talks on phone: Not on file    Gets together: Not on file    Attends religious service: Not on file    Active member of club or organization: Not on file    Attends meetings of clubs or organizations: Not on file    Relationship status: Not on file  Other Topics Concern  . Not on file  Social History Narrative  . Not on file     Family History:  The patient's family history includes Cancer in his  father; Diabetes in his mother; Hyperlipidemia in his mother; Hypertension in his mother and sister; Kidney disease in his mother. ***  ROS:   Please see the history of present illness.    ROS All other systems reviewed and are negative.   PHYSICAL EXAM:   VS:  There were no vitals taken for this visit.   GEN: Well nourished, well developed, in no acute distress  HEENT: normal  Neck: no JVD, carotid bruits, or masses Cardiac: ***RRR; no murmurs, rubs, or gallops,no edema  Respiratory:  clear to auscultation bilaterally, normal work of breathing GI: soft, nontender, nondistended, + BS MS: no deformity or atrophy  Skin: warm and dry, no rash Neuro:  Alert and Oriented x 3, Strength and sensation are intact Psych: euthymic mood, full affect  Wt Readings from Last 3 Encounters:  07/13/17 165 lb (74.8 kg)  10/06/16 169 lb 12.8 oz (77 kg)  06/09/15 171 lb 6.4 oz (77.7 kg)      Studies/Labs Reviewed:   EKG:  EKG is ordered today.  The ekg ordered today demonstrates ***  Recent Labs: No results found for requested labs within last 8760 hours.   Lipid Panel    Component Value Date/Time   CHOL 98 (L) 11/24/2016 0740   TRIG 69 11/24/2016 0740   HDL 30 (L) 11/24/2016 0740   CHOLHDL 3.3 11/24/2016 0740   CHOLHDL 3.2 12/08/2011 1000   VLDL 9 12/08/2011 1000   LDLCALC 54 11/24/2016 0740    Additional studies/ records that were reviewed today include:   Echocardiogram: 06/2015 Study Conclusions  - Left ventricle: The cavity size was normal. There was moderate   concentric hypertrophy. Systolic function was vigorous. The   estimated ejection fraction was in the range of 65% to 70%. Wall   motion was normal; there were no regional wall motion   abnormalities. Doppler parameters are consistent with abnormal   left ventricular relaxation (grade 1 diastolic dysfunction). The   E/e&' ratio is <8, suggesting normal LV filling pressure. - Aortic valve: Transvalvular velocity was  minimally increased.   There was no stenosis. There was no regurgitation. - Left atrium: The atrium was normal in size.  Impressions:  - LVEF 65-70%, vigorous LV function, normal wall motion, diastolic   dysfunction, normal LV filling pressure, normal LA size.  ASSESSMENT & PLAN:    1. HTN  2. Apical hypertrophy/LVH  3. DM  Medication Adjustments/Labs and Tests Ordered: Current medicines are reviewed at length with the patient today.  Concerns regarding medicines are outlined above.  Medication changes, Labs and Tests ordered today are listed in the Patient Instructions below. There are no Patient Instructions on file for this visit.  Lorelei Pont, [place]  07/19/2018 9:50 AM    [facility] [address] [place], [place], [place]  [address] Phone: [phone]; Fax: [phone]

## 2018-07-22 ENCOUNTER — Ambulatory Visit: Payer: Medicare HMO | Admitting: Physician Assistant

## 2018-08-07 ENCOUNTER — Other Ambulatory Visit: Payer: Self-pay | Admitting: Interventional Cardiology

## 2018-08-21 ENCOUNTER — Ambulatory Visit: Payer: Medicare HMO | Admitting: Interventional Cardiology

## 2018-08-21 ENCOUNTER — Encounter: Payer: Self-pay | Admitting: Interventional Cardiology

## 2018-08-21 ENCOUNTER — Ambulatory Visit: Payer: Medicare HMO | Admitting: Physician Assistant

## 2018-08-21 VITALS — BP 132/76 | HR 78 | Ht 65.5 in | Wt 170.4 lb

## 2018-08-21 DIAGNOSIS — I422 Other hypertrophic cardiomyopathy: Secondary | ICD-10-CM | POA: Diagnosis not present

## 2018-08-21 DIAGNOSIS — R9431 Abnormal electrocardiogram [ECG] [EKG]: Secondary | ICD-10-CM | POA: Diagnosis not present

## 2018-08-21 DIAGNOSIS — E119 Type 2 diabetes mellitus without complications: Secondary | ICD-10-CM

## 2018-08-21 DIAGNOSIS — I119 Hypertensive heart disease without heart failure: Secondary | ICD-10-CM

## 2018-08-21 MED ORDER — AMLODIPINE BESYLATE 10 MG PO TABS: 10.0000 mg | ORAL_TABLET | Freq: Every day | ORAL | 3 refills | Status: DC

## 2018-08-21 MED ORDER — LOSARTAN POTASSIUM 100 MG PO TABS: 100.0000 mg | ORAL_TABLET | Freq: Every day | ORAL | 3 refills | Status: DC

## 2018-08-21 MED ORDER — CARVEDILOL 6.25 MG PO TABS: 6.2500 mg | ORAL_TABLET | Freq: Two times a day (BID) | ORAL | 3 refills | Status: DC

## 2018-08-21 NOTE — Progress Notes (Signed)
Cardiology Office Note   Date:  08/21/2018   ID:  Sean Pittman Donaho, DOB 11/01/1950, MRN [id number]  PCP:  Iona HansenJones, Penny L, NP    No chief complaint on file.  Apical hypertrophy  Wt Readings from Last 3 Encounters:  08/21/18 170 lb 6.4 oz (77.3 kg)  07/13/17 165 lb (74.8 kg)  10/06/16 169 lb 12.8 oz (77 kg)       History of Present Illness: Sean Pittman Kinker is a 67 y.o. male  who has apical hypertrophy. He has had HTN as well.ECG is abnormal and he had a cath for concerning ECG in 12/2011 showing:  1. Normal left main coronary artery. 2. No significant atherosclerosis in the left anterior descending artery and its branches. 3. Normal left circumflex artery and its branches. 4. Normal right coronary artery. Normal left ventricular systolic function. LVEDP 30 mmHg. Ejection fraction 60%.  A1C has been high in the past.   Denies : Chest pain. Dizziness. Leg edema. Nitroglycerin use. Orthopnea. Palpitations. Paroxysmal nocturnal dyspnea. Shortness of breath. Syncope.   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   Past Medical History:  Diagnosis Date  . Apical variant hypertrophic cardiomyopathy (HCC)   . History of echocardiogram    Echo 11/16: Moderate LVH, vigorous LVF, EF 55-60%, normal wall motion, grade 1 diastolic dysfunction  . Hypertension   . S/P cardiac catheterization    LHC 4/13: no sig CAD, EF 60%. LVEDP was 30 mmHg. Evidence of apical hypertrophy.    Past Surgical History:  Procedure Laterality Date  . CORONARY ANGIOPLASTY    . LEFT HEART CATHETERIZATION WITH CORONARY ANGIOGRAM N/A 12/08/2011   Procedure: LEFT HEART CATHETERIZATION WITH CORONARY ANGIOGRAM;  Surgeon: Corky CraftsJayadeep S Blair Lundeen, MD;  [facility];  Service: Cardiovascular;  Laterality: N/A;  . LIPOMA EXCISION  2003     Current Outpatient Medications  Medication Sig Dispense Refill  . amLODipine (NORVASC) 10 MG tablet Take 1 tablet (10 mg total) by mouth daily. Please keep  upcoming appt in December for future refills. Thank you 90 tablet 0  . aspirin 81 MG tablet Take 81 mg by mouth daily.    Marland Kitchen. atorvastatin (LIPITOR) 40 MG tablet Take 40 mg daily by mouth.    . carvedilol (COREG) 6.25 MG tablet Take 1 tablet (6.25 mg total) by mouth 2 (two) times daily. Please keep upcoming appt in December for future refills. Thank you 180 tablet 0  . diclofenac sodium (VOLTAREN) 1 % GEL Apply 1 application as needed topically.    Marland Kitchen. losartan (COZAAR) 100 MG tablet Take 1 tablet (100 mg total) by mouth daily. Please keep upcoming appt in December for future refills. Thank you 90 tablet 0  . metFORMIN (GLUCOPHAGE) 500 MG tablet Take 1,000 mg by mouth 2 (two) times daily with a meal.      No current facility-administered medications for this visit.     Allergies:   Penicillins    Social History:  The patient  reports that he has never smoked. He has never used smokeless tobacco. He reports that he does not drink alcohol or use drugs.   Family History:  The patient's family history includes Cancer in his father; Diabetes in his mother; Hyperlipidemia in his mother; Hypertension in his mother and sister; Kidney disease in his mother.    ROS:  Please see the history of present illness.   Otherwise, review of systems are positive for lipoma in back of neck.   All other  systems are reviewed and negative.    PHYSICAL EXAM: VS:  BP 132/76   Pulse 78   Ht 5' 5.5" (1.664 m)   Wt 170 lb 6.4 oz (77.3 kg)   SpO2 98%   BMI 27.92 kg/m  , BMI Body mass index is 27.92 kg/m. GEN: Well nourished, well developed, in no acute distress  HEENT: normal  Neck: no JVD, carotid bruits, or masses Cardiac: RRR; 1/6 systolic murmur, no rubs, or gallops,no edema  Respiratory:  clear to auscultation bilaterally, normal work of breathing GI: soft, nontender, nondistended, + BS MS: no deformity or atrophy  Skin: warm and dry, no rash Neuro:  Strength and sensation are intact Psych: euthymic mood,  full affect   EKG:   The ekg ordered today demonstrates NSR, lateral T wave inversion, PVC   Recent Labs: No results found for requested labs within last 8760 hours.   Lipid Panel    Component Value Date/Time   CHOL 98 (L) 11/24/2016 0740   TRIG 69 11/24/2016 0740   HDL 30 (L) 11/24/2016 0740   CHOLHDL 3.3 11/24/2016 0740   CHOLHDL 3.2 12/08/2011 1000   VLDL 9 12/08/2011 1000   LDLCALC 54 11/24/2016 0740     Other studies Reviewed: Additional studies/ records that were reviewed today with results demonstrating:in [facility] everywhere: Cr 1.2, LDL 50, HDL 29;  .   ASSESSMENT AND PLAN:  1.   Apical hypertrophy/LVH:  No CHF sx. No volume overload at this time. High LVEDP at cath but has not required diuretics.  He does limit salt intake.  2.   DM type 2: A1C > 12 in 2018.  6.2 in 10/19.  Talked about getting more exercise. ALso talked about abvoiding sugar in the diet. Exercise to increased HDL.  3.   Abnormal ECG: Chronic T wave inversions.  HTN: The current medical regimen is effective;  continue present plan and medications. Labs checked with PMD.     Current medicines are reviewed at length with the patient today.  The patient concerns regarding his medicines were addressed.  The following changes have been made:  No change  Labs/ tests ordered today include:  No orders of the defined types were placed in this encounter.   Recommend 150 minutes/week of aerobic exercise Low fat, low carb, high fiber diet recommended  Disposition:   FU in 1 year   Signed, Lance Muss, MD  08/21/2018 3:24 PM    [facility] [address] [place], [place], [place]  [address] Phone: [phone]; Fax: [phone]

## 2018-08-21 NOTE — Patient Instructions (Signed)

## 2018-11-02 ENCOUNTER — Other Ambulatory Visit: Payer: Self-pay | Admitting: Interventional Cardiology

## 2018-11-06 NOTE — Telephone Encounter (Signed)
Spoke with patient's pharmacy and all doses of the losartan are on back order and they are requesting an alternative. Discussed with Dr. Eldridge Dace and he is okay with the patient switching to irbesartan 300 mg QD and will need a BMET in 1-2 weeks after the change.   Left message for patient to call back.

## 2018-11-08 NOTE — Telephone Encounter (Signed)
Spoke to patient. Patient states that he has about a month and a half supply of his losartan and wants to wait before he makes any changes to his medicine. Patient will call back a week or so before he runs out if the losartan is still on backorder and we will make the change at that time.

## 2019-02-19 ENCOUNTER — Other Ambulatory Visit: Payer: Self-pay | Admitting: Interventional Cardiology

## 2019-02-20 ENCOUNTER — Telehealth: Payer: Self-pay | Admitting: *Deleted

## 2019-02-20 NOTE — Telephone Encounter (Signed)
In [facility], [phone], Losartan 100 mg on backorder, request is for telmisartan (Micardis) 80 mg. pls advise. Thank you

## 2019-02-21 MED ORDER — TELMISARTAN 80 MG PO TABS: 80.0000 mg | ORAL_TABLET | Freq: Every day | ORAL | 3 refills | Status: DC

## 2019-02-21 NOTE — Addendum Note (Signed)
Addended by: Drue Novel I on: 02/21/2019 09:29 AM   Modules accepted: Orders

## 2019-02-21 NOTE — Telephone Encounter (Signed)
OK to switch to telmisartan

## 2019-02-21 NOTE — Telephone Encounter (Signed)
Patient has already been switched to telmisartan

## 2019-02-21 NOTE — Telephone Encounter (Signed)
Called and spoke to the patient and made him aware that [facility] called and said that there was a backorder on the losartan. Made patient aware that Dr. Irish Lack wants to switch to telmisartan 80 mg QD instead. Patient verbalized understanding. He will finish his current supply of losartan and then make the switch. Rx sent to pharmacy.

## 2019-09-23 ENCOUNTER — Other Ambulatory Visit: Payer: Self-pay | Admitting: Interventional Cardiology

## 2019-09-27 ENCOUNTER — Ambulatory Visit: Payer: Medicare HMO | Attending: Internal Medicine

## 2019-09-27 DIAGNOSIS — Z23 Encounter for immunization: Secondary | ICD-10-CM | POA: Insufficient documentation

## 2019-09-27 NOTE — Progress Notes (Signed)
   Covid-19 Vaccination Clinic  Name:  Sohan Potvin    MRN: [id number] DOB: 1951-07-09  09/27/2019  Mr. Poffenberger was observed post Covid-19 immunization for 30 minutes based on pre-vaccination screening without incidence. He was provided with Vaccine Information Sheet and instruction to access the V-Safe system.   Mr. Perko was instructed to call 911 with any severe reactions post vaccine: Marland Kitchen Difficulty breathing  . Swelling of your face and throat  . A fast heartbeat  . A bad rash all over your body  . Dizziness and weakness    Immunizations Administered    Name Date Dose VIS Date Route   Pfizer COVID-19 Vaccine 09/27/2019  3:08 PM 0.3 mL 08/15/2019 Intramuscular   Manufacturer: [employer], [employer]   Lot: [id number]   NDC: [address]-0

## 2019-09-30 ENCOUNTER — Ambulatory Visit: Payer: Medicare HMO | Admitting: Interventional Cardiology

## 2019-09-30 ENCOUNTER — Encounter: Payer: Self-pay | Admitting: Interventional Cardiology

## 2019-09-30 ENCOUNTER — Other Ambulatory Visit: Payer: Self-pay

## 2019-09-30 VITALS — BP 144/74 | HR 84 | Ht 65.5 in | Wt 167.0 lb

## 2019-09-30 DIAGNOSIS — I422 Other hypertrophic cardiomyopathy: Secondary | ICD-10-CM

## 2019-09-30 DIAGNOSIS — I10 Essential (primary) hypertension: Secondary | ICD-10-CM | POA: Diagnosis not present

## 2019-09-30 DIAGNOSIS — R9431 Abnormal electrocardiogram [ECG] [EKG]: Secondary | ICD-10-CM

## 2019-09-30 DIAGNOSIS — E119 Type 2 diabetes mellitus without complications: Secondary | ICD-10-CM | POA: Diagnosis not present

## 2019-09-30 MED ORDER — CARVEDILOL 12.5 MG PO TABS: 12.5000 mg | ORAL_TABLET | Freq: Two times a day (BID) | ORAL | 3 refills | Status: DC

## 2019-09-30 MED ORDER — ATORVASTATIN CALCIUM 20 MG PO TABS: 20.0000 mg | ORAL_TABLET | Freq: Every day | ORAL | 3 refills | Status: DC

## 2019-09-30 NOTE — Progress Notes (Signed)
Cardiology Office Note   Date:  09/30/2019   ID:  Sean Pittman, DOB 10-Oct-1950, MRN [id number]  PCP:  Sean Harvey, NP    No chief complaint on file.  Apical hypertrophy  Wt Readings from Last 3 Encounters:  09/30/19 167 lb (75.8 kg)  08/21/18 170 lb 6.4 oz (77.3 kg)  07/13/17 165 lb (74.8 kg)       History of Present Illness: Sean Pittman is a 69 y.o. male  who has apical hypertrophy. He has had HTN as well.ECG is abnormal and he had a cath for concerning ECG in 12/2011 showing:  1. Normal left main coronary artery. 2. No significant atherosclerosis in the left anterior descending artery and its branches. 3. Normal left circumflex artery and its branches. 4. Normal right coronary artery. Normal left ventricular systolic function. LVEDP 30 mmHg. Ejection fraction 60%.  A1C has been high in the past.   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  Since the last visit, he has done well.  He stays out of crowds and wears a mask.  He got his first dose of vaccine.  Denies : Chest pain. Dizziness. Leg edema. Nitroglycerin use. Orthopnea. Palpitations. Paroxysmal nocturnal dyspnea. Shortness of breath. Syncope.   He walks every other day for activity.  He goes about 15 minutes.   BP at home in the 062B systolic range  Past Medical History:  Diagnosis Date  . Apical variant hypertrophic cardiomyopathy ([place])   . History of echocardiogram    Echo 11/16: Moderate LVH, vigorous LVF, EF 55-60%, normal wall motion, grade 1 diastolic dysfunction  . Hypertension   . S/P cardiac catheterization    LHC 4/13: no sig CAD, EF 60%. LVEDP was 30 mmHg. Evidence of apical hypertrophy.    Past Surgical History:  Procedure Laterality Date  . CORONARY ANGIOPLASTY    . LEFT HEART CATHETERIZATION WITH CORONARY ANGIOGRAM N/A 12/08/2011   Procedure: LEFT HEART CATHETERIZATION WITH CORONARY ANGIOGRAM;  Surgeon: Sean Booze, MD;  Location: [facility] CATH LAB;  Service:  Cardiovascular;  Laterality: N/A;  . LIPOMA EXCISION  2003     Current Outpatient Medications  Medication Sig Dispense Refill  . amLODipine (NORVASC) 10 MG tablet Take 1 tablet (10 mg total) by mouth daily. Please make overdue appt with Dr. Irish Pittman before anymore refills. 1st attempt 30 tablet 0  . aspirin 81 MG tablet Take 81 mg by mouth daily.    Marland Kitchen atorvastatin (LIPITOR) 40 MG tablet Take 40 mg daily by mouth.    . carvedilol (COREG) 6.25 MG tablet Take 1 tablet (6.25 mg total) by mouth 2 (two) times daily. Please make overdue appt with Dr. Irish Pittman before anymore refills. 1st attempt 60 tablet 0  . diclofenac sodium (VOLTAREN) 1 % GEL Apply 1 application as needed topically.    . metFORMIN (GLUCOPHAGE) 500 MG tablet Take 1,000 mg by mouth 2 (two) times daily with a meal.     . telmisartan (MICARDIS) 80 MG tablet Take 1 tablet (80 mg total) by mouth daily. 90 tablet 3   No current facility-administered medications for this visit.    Allergies:   Penicillins    Social History:  The patient  reports that he has never smoked. He has never used smokeless tobacco. He reports that he does not drink alcohol or use drugs.   Family History:  The patient's family history includes Cancer in his father; Diabetes in his mother; Hyperlipidemia in his mother; Hypertension in his  mother and sister; Kidney disease in his mother.    ROS:  Please see the history of present illness.   Otherwise, review of systems are positive for knee pain.   All other systems are reviewed and negative.    PHYSICAL EXAM: VS:  Wt 167 lb (75.8 kg)   BMI 27.37 kg/m  , BMI Body mass index is 27.37 kg/m. GEN: Well nourished, well developed, in no acute distress  HEENT: normal  Neck: no JVD, carotid bruits, or masses Cardiac: RRR; no murmurs, rubs, or gallops,no edema  Respiratory:  clear to auscultation bilaterally, normal work of breathing GI: soft, nontender, nondistended, + BS MS: no deformity or atrophy  Skin:  warm and dry, no rash Neuro:  Strength and sensation are intact Psych: euthymic mood, full affect   EKG:   The ekg ordered today demonstrates NSR, LVH and associated T wave changes   Recent Labs: No results found for requested labs within last 8760 hours.   Lipid Panel    Component Value Date/Time   CHOL 98 (L) 11/24/2016 0740   TRIG 69 11/24/2016 0740   HDL 30 (L) 11/24/2016 0740   CHOLHDL 3.3 11/24/2016 0740   CHOLHDL 3.2 12/08/2011 1000   VLDL 9 12/08/2011 1000   LDLCALC 54 11/24/2016 0740     Other studies Reviewed: Additional studies/ records that were reviewed today with results demonstrating: prior cath reviewed.   ASSESSMENT AND PLAN:  1. Apical hypertrophy: No CHF sx. Appears euvolemic.  2. DM2: A1C 6.3 at last check with PMD in 06/2019.  3. Abnormal ECG: LVH from apical variant HCM.  4. HTN: BP in the 140s range. Cr 1.13.  Increase Coreg to 12.5 mg BID.  BP check with PMD on Thursday.  5. Decrease  Atorvastatin to 20 mg daily.  LFTs stable. Lipids checked with PMD.  LDL was low.   Current medicines are reviewed at length with the patient today.  The patient concerns regarding his medicines were addressed.  The following changes have been made:  Change to Coreg and atorvastatin  Labs/ tests ordered today include: checked by PMD No orders of the defined types were placed in this encounter.   Recommend 150 minutes/week of aerobic exercise Low fat, low carb, high fiber diet recommended  Disposition:   FU in 1 year   Signed, Sean Muss, MD  09/30/2019 2:11 PM    [facility] [address] [place], [place], [place]  [address] Phone: [phone]; Fax: [phone]

## 2019-09-30 NOTE — Patient Instructions (Signed)
Medication Instructions:  Your physician has recommended you make the following change in your medication:   1. INCREASE: carvedilol (coreg) to 12.5 mg twice a day  2. DECREASE: atorvastatin (lipitor) to 20 mg once a day  *If you need a refill on your cardiac medications before your next appointment, please call your pharmacy*  Lab Work: None ordered  If you have labs (blood work) drawn today and your tests are completely normal, you will receive your results only by: Marland Kitchen MyChart Message (if you have MyChart) OR . A paper copy in the mail If you have any lab test that is abnormal or we need to change your treatment, we will call you to review the results.  Testing/Procedures: None ordered  Follow-Up: At [facility], you and your health needs are our priority.  As part of our continuing mission to provide you with exceptional heart care, we have created designated Provider Care Teams.  These Care Teams include your primary Cardiologist (physician) and Advanced Practice Providers (APPs -  Physician Assistants and Nurse Practitioners) who all work together to provide you with the care you need, when you need it.  Your next appointment:   12 month(s)  The format for your next appointment:   In Person  Provider:   You may see Everette Rank, MD or one of the following Advanced Practice Providers on your designated Care Team:    Ronie Spies, PA-C  Jacolyn Reedy, PA-C   Other Instructions

## 2019-10-03 ENCOUNTER — Ambulatory Visit: Payer: Medicare HMO

## 2019-10-14 ENCOUNTER — Ambulatory Visit: Payer: Medicare HMO

## 2019-10-19 ENCOUNTER — Ambulatory Visit: Payer: Medicare HMO | Attending: Internal Medicine

## 2019-10-19 DIAGNOSIS — Z23 Encounter for immunization: Secondary | ICD-10-CM

## 2019-10-19 NOTE — Progress Notes (Signed)
   Covid-19 Vaccination Clinic  Name:  Sean Pittman    MRN: [id number] DOB: [birth year]  10/19/2019  [name] was observed post Covid-19 immunization for 15 minutes without incidence. He was provided with Vaccine Information Sheet and instruction to access the V-Safe system.   [name] was instructed to call 911 with any severe reactions post vaccine: Marland Kitchen Difficulty breathing  . Swelling of your face and throat  . A fast heartbeat  . A bad rash all over your body  . Dizziness and weakness    Immunizations Administered    Name Date Dose VIS Date Route   Pfizer COVID-19 Vaccine 10/19/2019  1:25 PM 0.3 mL 08/15/2019 Intramuscular   Manufacturer: [employer], [employer]   Lot: [id number]   NDC: [address]-8

## 2019-10-27 ENCOUNTER — Other Ambulatory Visit: Payer: Self-pay | Admitting: Interventional Cardiology

## 2019-11-28 ENCOUNTER — Other Ambulatory Visit: Payer: Self-pay | Admitting: Interventional Cardiology

## 2020-03-31 ENCOUNTER — Other Ambulatory Visit: Payer: Self-pay | Admitting: Interventional Cardiology

## 2020-04-10 ENCOUNTER — Other Ambulatory Visit: Payer: Self-pay

## 2020-04-10 ENCOUNTER — Emergency Department (HOSPITAL_COMMUNITY): Payer: Medicare HMO

## 2020-04-10 ENCOUNTER — Encounter (HOSPITAL_COMMUNITY): Payer: Self-pay | Admitting: Emergency Medicine

## 2020-04-10 ENCOUNTER — Emergency Department (HOSPITAL_COMMUNITY)
Admission: EM | Admit: 2020-04-10 | Discharge: 2020-04-10 | Disposition: A | Discharge status: Home / Self Care | Payer: Medicare HMO | Attending: Emergency Medicine | Admitting: Emergency Medicine

## 2020-04-10 DIAGNOSIS — J189 Pneumonia, unspecified organism: Secondary | ICD-10-CM

## 2020-04-10 DIAGNOSIS — R197 Diarrhea, unspecified: Secondary | ICD-10-CM | POA: Diagnosis not present

## 2020-04-10 DIAGNOSIS — I10 Essential (primary) hypertension: Secondary | ICD-10-CM | POA: Insufficient documentation

## 2020-04-10 DIAGNOSIS — Z20822 Contact with and (suspected) exposure to covid-19: Secondary | ICD-10-CM | POA: Diagnosis not present

## 2020-04-10 DIAGNOSIS — R0789 Other chest pain: Secondary | ICD-10-CM | POA: Diagnosis present

## 2020-04-10 DIAGNOSIS — E119 Type 2 diabetes mellitus without complications: Secondary | ICD-10-CM | POA: Diagnosis not present

## 2020-04-10 DIAGNOSIS — R509 Fever, unspecified: Secondary | ICD-10-CM | POA: Insufficient documentation

## 2020-04-10 DIAGNOSIS — R05 Cough: Secondary | ICD-10-CM | POA: Insufficient documentation

## 2020-04-10 DIAGNOSIS — Z79899 Other long term (current) drug therapy: Secondary | ICD-10-CM | POA: Diagnosis not present

## 2020-04-10 DIAGNOSIS — Z7984 Long term (current) use of oral hypoglycemic drugs: Secondary | ICD-10-CM | POA: Diagnosis not present

## 2020-04-10 LAB — CBC
HCT: 41 % (ref 39.0–52.0)
Hemoglobin: 13.4 g/dL (ref 13.0–17.0)
MCH: 28.9 pg (ref 26.0–34.0)
MCHC: 32.7 g/dL (ref 30.0–36.0)
MCV: 88.6 fL (ref 80.0–100.0)
Platelets: 236 10*3/uL (ref 150–400)
RBC: 4.63 MIL/uL (ref 4.22–5.81)
RDW: 14.2 % (ref 11.5–15.5)
WBC: 14 10*3/uL — ABNORMAL HIGH (ref 4.0–10.5)
nRBC: 0 % (ref 0.0–0.2)

## 2020-04-10 LAB — BASIC METABOLIC PANEL
Anion gap: 13 (ref 5–15)
BUN: 17 mg/dL (ref 8–23)
CO2: 26 mmol/L (ref 22–32)
Calcium: 9.5 mg/dL (ref 8.9–10.3)
Chloride: 105 mmol/L (ref 98–111)
Creatinine, Ser: 1.32 mg/dL — ABNORMAL HIGH (ref 0.61–1.24)
GFR calc Af Amer: >60 mL/min (ref >60)
GFR calc non Af Amer: 54 mL/min — ABNORMAL LOW (ref >60)
Glucose, Bld: 107 mg/dL — ABNORMAL HIGH (ref 70–99)
Potassium: 4.5 mmol/L (ref 3.5–5.1)
Sodium: 144 mmol/L (ref 135–145)

## 2020-04-10 LAB — TROPONIN I (HIGH SENSITIVITY)
Troponin I (High Sensitivity): 4 ng/L (ref <18)
Troponin I (High Sensitivity): 5 ng/L (ref <18)

## 2020-04-10 LAB — SARS CORONAVIRUS 2 BY RT PCR (HOSPITAL ORDER, PERFORMED IN ~~LOC~~ HOSPITAL LAB): SARS Coronavirus 2: NEGATIVE

## 2020-04-10 MED ORDER — LEVOFLOXACIN 750 MG PO TABS
750.0000 mg | ORAL_TABLET | Freq: Once | ORAL | Status: AC
  Administered 2020-04-10: 750 mg via ORAL
  Filled 2020-04-10: qty 1

## 2020-04-10 MED ORDER — SODIUM CHLORIDE 0.9% FLUSH: 3.0000 mL | Freq: Once | INTRAVENOUS | Status: DC

## 2020-04-10 MED ORDER — LEVOFLOXACIN 750 MG PO TABS: 750.0000 mg | ORAL_TABLET | Freq: Every day | ORAL | 0 refills | Status: AC

## 2020-04-10 NOTE — Discharge Instructions (Signed)
Take antibiotics as prescribed.  Take the entire course, even if your symptoms improve. Follow-up with your primary care doctor as needed for further evaluation of your symptoms. Return to the emergency room if you develop increased difficulty breathing, severe worsening chest pain, increasing confusion, or any new, worsening, or concerning symptoms.

## 2020-04-10 NOTE — ED Provider Notes (Signed)
  Physical Exam  BP (!) 148/82   Pulse 81   Temp 98.8 F (37.1 C) (Rectal)   Resp (!) 21   SpO2 97%   Physical Exam  Gen: appears nontoxic   ED Course/Procedures    Procedures  MDM   Pt signed out to me by Carma Lair, PA-C. Please see previous notes for further history.   In brief patient presented for evaluation of chest pain, cough, and nausea/vomiting that began today.  On exam, patient is nontoxic.  Initial cardiac work-up is reassuring.  Initial troponin negative at 4.  EKG without signs of ischemia.  Patient has a mild leukocytosis of 14.  Chest x-ray shows bilateral infiltrate, likely the cause of her symptoms.  He is pending a delta troponin.  Per port score, patient does not need to be admitted, can be treated outpatient.  Repeat troponin stable at 5.  Covid test negative.  On my evaluation, patient is nontoxic.  He is not hypoxic, no respiratory distress.  Discussed findings of pneumonia.  Discussed treatment with antibiotics.  Discussed close follow-up with PCP, prompt return to the ER with any worsening symptoms.  At this time, patient appears safe for discharge.  Return precautions given.  Patient states he understands and agrees to plan.      Alveria Apley, PA-C 04/10/20 2307    Milagros Loll, MD 04/12/20 [phone]

## 2020-04-10 NOTE — ED Provider Notes (Signed)
   Date: 04/10/2020  Rate: 89  Rhythm: normal sinus rhythm  QRS Axis: indeterminate  PR and QT Intervals: normal  ST/T Wave abnormalities: nonspecific ST changes  PR and QRS Conduction Disutrbances:none  Narrative Interpretation: Suspect arm lead reversal  Old EKG Reviewed: changes noted    Mancel Bale, MD 04/10/20 1218

## 2020-04-10 NOTE — ED Provider Notes (Addendum)
[facility]-EMERGENCY DEPT Provider Note   CSN: [id number] Arrival date & time: 04/10/20  1046     History Chief Complaint  Patient presents with  . Weakness  . Chest Pain  . Nausea    Sean Pittman is a 69 y.o. male.  HPI 69 year old male with a history of HTN, DM type II, hypertension presents to the ER for cough, loose stools, weakness and left-sided chest pain with nausea which started around 4 AM.  Patient states he woke up coughing, with nonbloody productive phlegm.  He states that he also felt the need to use the restroom, when he went, he thinks he had a loose stool.  He states he does not know if he had blood in his stool as he did not look.  He states he has felt feverish, but has not measured it temperature.  Denies any urinary complaints.  No abdominal pain.  He also endorses left-sided chest pain which is a centralized and does not radiate.  Last episode of chest pain was around 12 PM today.  Patient had a left heart cath with coronary angioplasty in 2013.  Patient states he is vaccinated for Covid.  No known sick contacts.  No headache or nasal congestion.  Endorses shortness of breath only when coughing, not with ambulation.    Past Medical History:  Diagnosis Date  . Apical variant hypertrophic cardiomyopathy (HCC)   . History of echocardiogram    Echo 11/16: Moderate LVH, vigorous LVF, EF 55-60%, normal wall motion, grade 1 diastolic dysfunction  . Hypertension   . S/P cardiac catheterization    LHC 4/13: no sig CAD, EF 60%. LVEDP was 30 mmHg. Evidence of apical hypertrophy.    Patient Active Problem List   Diagnosis Date Noted  . Apical variant hypertrophic cardiomyopathy (HCC) 07/13/2017  . Type 2 diabetes mellitus without complications (HCC) 10/06/2016  . LVH (left ventricular hypertrophy) due to hypertensive disease, without heart failure 10/06/2016  . Nonspecific abnormal results of cardiovascular function study 03/16/2014  .  Nonspecific abnormal electrocardiogram (ECG) (EKG) 03/16/2014  . Essential hypertension, benign 03/16/2014    Past Surgical History:  Procedure Laterality Date  . CORONARY ANGIOPLASTY    . LEFT HEART CATHETERIZATION WITH CORONARY ANGIOGRAM N/A 12/08/2011   Procedure: LEFT HEART CATHETERIZATION WITH CORONARY ANGIOGRAM;  Surgeon: Corky Crafts, MD;  Location: [facility] CATH LAB;  Service: Cardiovascular;  Laterality: N/A;  . LIPOMA EXCISION  2003       Family History  Problem Relation Age of Onset  . Hypertension Mother   . Diabetes Mother   . Hyperlipidemia Mother   . Kidney disease Mother   . Cancer Father   . Hypertension Sister     Social History   Tobacco Use  . Smoking status: Never Smoker  . Smokeless tobacco: Never Used  Substance Use Topics  . Alcohol use: No  . Drug use: No    Home Medications Prior to Admission medications   Medication Sig Start Date End Date Taking? Authorizing Provider  amLODipine (NORVASC) 10 MG tablet Take 1 tablet (10 mg total) by mouth daily. 10/27/19   Corky Crafts, MD  aspirin 81 MG tablet Take 81 mg by mouth daily.    [provider]  atorvastatin (LIPITOR) 20 MG tablet Take 1 tablet (20 mg total) by mouth daily. 09/30/19   Corky Crafts, MD  carvedilol (COREG) 12.5 MG tablet TAKE 1 TABLET BY MOUTH 2 TIMES DAILY. 03/31/20   Corky Crafts,  MD  diclofenac sodium (VOLTAREN) 1 % GEL Apply 1 application as needed topically. 04/12/17   [provider]  metFORMIN (GLUCOPHAGE) 500 MG tablet Take 1,000 mg by mouth 2 (two) times daily with a meal.  02/14/14   [provider]  telmisartan (MICARDIS) 80 MG tablet TAKE 1 TABLET BY MOUTH EVERY DAY 11/28/19   Corky Crafts, MD    Allergies    Penicillins  Review of Systems   Review of Systems  Constitutional: Positive for chills and fever.  HENT: Negative for ear pain and sore throat.   Eyes: Negative for pain and visual disturbance.  Respiratory:  Positive for cough and shortness of breath.   Cardiovascular: Positive for chest pain. Negative for palpitations.  Gastrointestinal: Positive for diarrhea. Negative for abdominal pain and vomiting.  Genitourinary: Negative for dysuria and hematuria.  Musculoskeletal: Negative for arthralgias and back pain.  Skin: Negative for color change and rash.  Neurological: Negative for seizures, syncope and headaches.  Psychiatric/Behavioral: Negative for confusion.  All other systems reviewed and are negative.   Physical Exam Updated Vital Signs BP (!) 147/86 (BP Location: Left Arm)   Pulse 74   Temp 98.8 F (37.1 C) (Rectal)   Resp (!) 22   SpO2 99%   Physical Exam Vitals and nursing note reviewed.  Constitutional:      General: He is not in acute distress.    Appearance: He is well-developed. He is not toxic-appearing.     Comments: Alert, oriented, walking around the room and changing into hospital scrubs.  No difficulty with balance.  Does not appear weak.  HENT:     Head: Normocephalic and atraumatic.  Eyes:     Conjunctiva/sclera: Conjunctivae normal.  Cardiovascular:     Rate and Rhythm: Normal rate and regular rhythm.     Pulses:          Radial pulses are 2+ on the right side and 2+ on the left side.     Heart sounds: Normal heart sounds. No murmur heard.   Pulmonary:     Effort: Pulmonary effort is normal. No accessory muscle usage or respiratory distress.     Breath sounds: Normal breath sounds.  Abdominal:     Palpations: Abdomen is soft.     Tenderness: There is no abdominal tenderness.  Musculoskeletal:        General: Normal range of motion.     Cervical back: Neck supple.     Right lower leg: No edema.  Skin:    General: Skin is warm and dry.     Capillary Refill: Capillary refill takes less than 2 seconds.     Findings: No erythema.  Neurological:     General: No focal deficit present.     Mental Status: He is alert and oriented to person, place, and time.       Comments: Speaking in clear sentences, without evidence of word slurring or aphasia.  Follows two-step commands.     ED Results / Procedures / Treatments   Labs (all labs ordered are listed, but only abnormal results are displayed) Labs Reviewed  BASIC METABOLIC PANEL - Abnormal; Notable for the following components:      Result Value   Glucose, Bld 107 (*)    Creatinine, Ser 1.32 (*)    GFR calc non Af Amer 54 (*)    All other components within normal limits  CBC - Abnormal; Notable for the following components:   WBC 14.0 (*)  All other components within normal limits  SARS CORONAVIRUS 2 BY RT PCR (HOSPITAL ORDER, PERFORMED IN [facility])  URINALYSIS, ROUTINE W REFLEX MICROSCOPIC  TROPONIN I (HIGH SENSITIVITY)  TROPONIN I (HIGH SENSITIVITY)    EKG None  Radiology DG Chest 2 View  Result Date: 04/10/2020 CLINICAL DATA:  Chest pain. Weakness. Nausea. Symptoms started at 4 a.m. EXAM: CHEST - 2 VIEW COMPARISON:  06/29/2017 FINDINGS: Heart size is normal. There is perihilar peribronchial thickening. Patchy opacity is identified in the MEDIAL RIGHT lung base and in the lingula. No evidence for pulmonary edema. No pleural effusions. IMPRESSION: Bilateral infiltrates. Electronically Signed   By: Norva Pavlov M.D.   On: 04/10/2020 11:32    Procedures Procedures (including critical care time)  Medications Ordered in ED Medications  sodium chloride flush (NS) 0.9 % injection 3 mL (has no administration in time range)    ED Course  I have reviewed the triage vital signs and the nursing notes.  Pertinent labs & imaging results that were available during my care of the patient were reviewed by me and considered in my medical decision making (see chart for details).  Clinical Course as of Apr 10 1754  Sat Apr 10, 2020  1751 SpO2: 97 % [[place]]  1755 Troponin I (High Sensitivity): 4 [MB]    Clinical Course User Index [MB] Mare Ferrari, PA-C [[place]] Alveria Apley, PA-C   MDM Rules/Calculators/A&P                          69 year old male with cough, centralized chest pain, weakness since 4 am this morning On presentation, he is alert and oriented, nontoxic-appearing, with no evidence of respiratory distress, speaking full sentences without increased work of breathing, nondiaphoretic.  Vital signs on presentation with slight elevated BP of 147/86, pulse of 74, O2 sats at 99%.  Oral temp borderline at 99.6, will repeat rectal.  Physical exam with clear lung sounds, no evidence tachycardia, soft nontender abdomen.    DDx: ACS, PE, pneumothax, pneumonia, COVID, dissection   I personally reviewed and interpreted his labs CBC with leukocytosis of 14.  Normal hemoglobin.  Normal platelets. BMP without any electrolyte abnormalities, creatinine of 1.32 which does appear elevated from his values 2 years ago at 1.03. Initial troponin of 4.  IMAGING:  DG chest w/ bilateral infiltrates and right opacity at the right lingula  MDM:  Concern for PE low at this time, pt is not hypoxic, tachycardic, not complaining of SOB Per chart review, pt seen by Dr[facility] Eldridge Dace w/ [facility], normal left main, left circumflex and braches, normal right coronary artery, normal left ventricular systolic function, EF of 60%  Pt's CXR w/ questionable with bilateral infiltrates; pneumonia; COVID test pending  UA pending  Second tropinin pending Rectal temp of 98.8  DISPOSITION: Signed out patient to [employer] who will oversee second troponin, COVID test and the rest of his workup. Given the patient is not hypoxic, febrile, and as long as delta troponin and COVID test are negative, suspect the patient will be stable for discharge with treatment for CAP given CXR findings. Please see her note for further dispo.  Final Clinical Impression(s) / ED Diagnoses Final diagnoses:  None    Rx / DC Orders ED Discharge Orders    None          Mare Ferrari, PA-C 04/10/20  1756    Milagros Loll, MD 04/12/20 [phone]

## 2020-04-10 NOTE — ED Triage Notes (Signed)
Patient here brought in by son reporting weakness, left sided chest pain, nausea that started at 4am. Denies fall.

## 2020-08-04 ENCOUNTER — Other Ambulatory Visit: Payer: Self-pay | Admitting: Interventional Cardiology

## 2021-01-12 ENCOUNTER — Other Ambulatory Visit: Payer: Self-pay | Admitting: Interventional Cardiology

## 2021-01-17 ENCOUNTER — Telehealth: Payer: Self-pay | Admitting: Interventional Cardiology

## 2021-01-17 MED ORDER — TELMISARTAN 80 MG PO TABS: 80.0000 mg | ORAL_TABLET | Freq: Every day | ORAL | 0 refills | Status: DC

## 2021-01-17 NOTE — Telephone Encounter (Signed)
Pt's medication was sent to pt's pharmacy as requested. Confirmation received.  °

## 2021-01-17 NOTE — Telephone Encounter (Signed)
*  STAT* If patient is at the pharmacy, call can be transferred to refill team.   1. Which medications need to be refilled? (please list name of each medication and dose if known) telmisartan (MICARDIS) 80 MG tablet  2. Which pharmacy/location (including street and city if local pharmacy) is medication to be sent to? [id number] - [place], [place] - [address]  3. Do they need a 30 day or 90 day supply? 90

## 2021-03-08 ENCOUNTER — Other Ambulatory Visit: Payer: Self-pay | Admitting: Interventional Cardiology

## 2021-03-10 ENCOUNTER — Other Ambulatory Visit: Payer: Self-pay | Admitting: Cardiovascular Disease

## 2021-03-10 ENCOUNTER — Other Ambulatory Visit: Payer: Self-pay

## 2021-03-21 ENCOUNTER — Other Ambulatory Visit: Payer: Self-pay | Admitting: Interventional Cardiology

## 2021-03-23 NOTE — Progress Notes (Signed)
Cardiology Office Note   Date:  03/24/2021   ID:  Sean Pittman, DOB 02-Sep-1951, MRN [id number]  PCP:  Iona Hansen, NP    No chief complaint on file.  Apical hypertrophy  Wt Readings from Last 3 Encounters:  03/24/21 169 lb 12.8 oz (77 kg)  09/30/19 167 lb (75.8 kg)  08/21/18 170 lb 6.4 oz (77.3 kg)       History of Present Illness: Sean Pittman is a 70 y.o. male   who has apical hypertrophy. He has had HTN as well. ECG is abnormal and he had a cath for concerning ECG in 12/2011 showing:   Normal left main coronary artery. No significant atherosclerosis in the left anterior descending artery and its branches. Normal left circumflex artery and its branches. Normal right coronary artery. Normal left ventricular systolic function.  LVEDP 30 mmHg.  Ejection fraction 60%.   A1C has been high in the past.    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      Past Medical History:  Diagnosis Date   Apical variant hypertrophic cardiomyopathy (HCC)    History of echocardiogram    Echo 11/16: Moderate LVH, vigorous LVF, EF 55-60%, normal wall motion, grade 1 diastolic dysfunction   Hypertension    S/P cardiac catheterization    LHC 4/13: no sig CAD, EF 60%.  LVEDP was 30 mmHg.  Evidence of apical hypertrophy.    Past Surgical History:  Procedure Laterality Date   CORONARY ANGIOPLASTY     LEFT HEART CATHETERIZATION WITH CORONARY ANGIOGRAM N/A 12/08/2011   Procedure: LEFT HEART CATHETERIZATION WITH CORONARY ANGIOGRAM;  Surgeon: Corky Crafts, MD;  Location: [facility] CATH LAB;  Service: Cardiovascular;  Laterality: N/A;   LIPOMA EXCISION  2003     Current Outpatient Medications  Medication Sig Dispense Refill   amLODipine (NORVASC) 10 MG tablet TAKE 1 TABLET BY MOUTH EVERY DAY 90 tablet 3   aspirin 81 MG tablet Take 81 mg by mouth daily.     atorvastatin (LIPITOR) 20 MG tablet TAKE 1 TABLET BY MOUTH EVERY DAY 90 tablet 3   carvedilol (COREG) 12.5 MG tablet  Take 1 tablet (12.5 mg total) by mouth 2 (two) times daily. Please make overdue appt with Dr. Eldridge Dace before anymore refills. Thank you 3rd and final attempt 30 tablet 0   diclofenac sodium (VOLTAREN) 1 % GEL Apply 1 application as needed topically.     metFORMIN (GLUCOPHAGE) 500 MG tablet Take 1,000 mg by mouth 2 (two) times daily with a meal.      telmisartan (MICARDIS) 80 MG tablet Take 1 tablet (80 mg total) by mouth daily. Please keep upcoming appt in July 2022 with Dr. Eldridge Dace before anymore refills. Thank you 90 tablet 0   No current facility-administered medications for this visit.    Allergies:   Levofloxacin and Penicillins    Social History:  The patient  reports that he has never smoked. He has never used smokeless tobacco. He reports that he does not drink alcohol and does not use drugs.   Family History:  The patient's family history includes Cancer in his father; Diabetes in his mother; Hyperlipidemia in his mother; Hypertension in his mother and sister; Kidney disease in his mother.    ROS:  Please see the history of present illness.   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   All other systems are reviewed and negative.    PHYSICAL EXAM: VS:  BP 140/60  Pulse 73   Ht 5' 5.5" (1.664 m)   Wt 169 lb 12.8 oz (77 kg)   SpO2 95%   BMI 27.83 kg/m  , BMI Body mass index is 27.83 kg/m. GEN: Well nourished, well developed, in no acute distress HEENT: normal Neck: no JVD, carotid bruits, or masses Cardiac: RRR; early 1/6 systolic murmur, no rubs, or gallops,no edema  Respiratory:  clear to auscultation bilaterally, normal work of breathing GI: soft, nontender, nondistended, + BS MS: no deformity ; lipoma on upper back Skin: warm and dry, no rash Neuro:  Strength and sensation are intact Psych: euthymic mood, full affect   EKG:   The ekg ordered today demonstrates NSR, LVH with associated ST-T wave changes   Recent Labs: 04/10/2020: BUN 17;  Creatinine, Ser 1.32; Hemoglobin 13.4; Platelets 236; Potassium 4.5; Sodium 144   Lipid Panel    Component Value Date/Time   CHOL 98 (L) 11/24/2016 0740   TRIG 69 11/24/2016 0740   HDL 30 (L) 11/24/2016 0740   CHOLHDL 3.3 11/24/2016 0740   CHOLHDL 3.2 12/08/2011 1000   VLDL 9 12/08/2011 1000   LDLCALC 54 11/24/2016 0740     Other studies Reviewed: Additional studies/ records that were reviewed today with results demonstrating: labs reviewed.   ASSESSMENT AND PLAN:  Apical hypertrophy: No CHF sx.  Continue with BP control.   DM2: Continue statin given DM. Controlled on metformin. Whole food, plant based diet. A1C 6.3. Abnormal ECG: LVH from apical variant HCM.  Similar pattern today to prior.  HTN: The current medical regimen is effective;  continue present plan and medications. CRI: Cr 1.45. Avoid nephrotoxins.  Anemia: notred in 2021.  I recommended that he get his colonoscopy as recommended by the PMD.       Current medicines are reviewed at length with the patient today.  The patient concerns regarding his medicines were addressed.  The following changes have been made:  No change  Labs/ tests ordered today include:  No orders of the defined types were placed in this encounter.   Recommend 150 minutes/week of aerobic exercise Low fat, low carb, high fiber diet recommended  Disposition:   FU in 1 year   Signed, Lance Muss, MD  03/24/2021 2:13 PM    [facility] [address] [place], [place], [place]  [address] Phone: [phone]; Fax: [phone]

## 2021-03-24 ENCOUNTER — Other Ambulatory Visit: Payer: Self-pay

## 2021-03-24 ENCOUNTER — Encounter: Payer: Self-pay | Admitting: Interventional Cardiology

## 2021-03-24 ENCOUNTER — Ambulatory Visit: Payer: Medicare HMO | Admitting: Interventional Cardiology

## 2021-03-24 VITALS — BP 140/60 | HR 73 | Ht 65.5 in | Wt 169.8 lb

## 2021-03-24 DIAGNOSIS — R9431 Abnormal electrocardiogram [ECG] [EKG]: Secondary | ICD-10-CM | POA: Diagnosis not present

## 2021-03-24 DIAGNOSIS — I10 Essential (primary) hypertension: Secondary | ICD-10-CM

## 2021-03-24 DIAGNOSIS — I422 Other hypertrophic cardiomyopathy: Secondary | ICD-10-CM | POA: Diagnosis not present

## 2021-03-24 DIAGNOSIS — E119 Type 2 diabetes mellitus without complications: Secondary | ICD-10-CM

## 2021-03-24 MED ORDER — CARVEDILOL 12.5 MG PO TABS: 12.5000 mg | ORAL_TABLET | Freq: Two times a day (BID) | ORAL | 3 refills | Status: DC

## 2021-03-24 NOTE — Patient Instructions (Signed)

## 2021-05-14 ENCOUNTER — Other Ambulatory Visit: Payer: Self-pay | Admitting: Cardiovascular Disease

## 2021-05-30 ENCOUNTER — Other Ambulatory Visit: Payer: Self-pay

## 2021-05-30 MED ORDER — TELMISARTAN 80 MG PO TABS: 80.0000 mg | ORAL_TABLET | Freq: Every day | ORAL | 3 refills | Status: DC

## 2021-08-19 ENCOUNTER — Other Ambulatory Visit: Payer: Self-pay

## 2021-08-19 MED ORDER — AMLODIPINE BESYLATE 10 MG PO TABS: 10.0000 mg | ORAL_TABLET | Freq: Every day | ORAL | 2 refills | Status: DC

## 2021-08-19 MED ORDER — ATORVASTATIN CALCIUM 20 MG PO TABS: 20.0000 mg | ORAL_TABLET | Freq: Every day | ORAL | 2 refills | Status: DC

## 2021-08-19 NOTE — Telephone Encounter (Signed)
Pt's medications were sent to pt's pharmacy as requested. Confirmation received.  

## 2022-03-17 ENCOUNTER — Other Ambulatory Visit: Payer: Self-pay | Admitting: Interventional Cardiology

## 2022-04-09 ENCOUNTER — Other Ambulatory Visit: Payer: Self-pay | Admitting: Interventional Cardiology

## 2022-05-03 NOTE — Progress Notes (Unsigned)
Office Visit    Patient Name: Sean Pittman Date of Encounter: 05/04/2022  PCP:  Iona Hansen, NP   [facility]  Cardiologist:  Lance Muss, MD  Advanced Practice Provider:  No care team member to display Electrophysiologist:  None   HPI    Sean Pittman is a 71 y.o. male with a past medical history significant for apical hypertrophy, hypertension presents today for follow-up visit.  Cardiac cath 12/2011 with normal left main coronary artery, no significant atherosclerosis in the LAD and its branches, normal left circumflex artery and its branches, normal right coronary artery, normal left ventricular systolic function, EF 60%.  Today, he states that he has not had any shortness of breath or chest pain.  He tries to get some exercise here and there.  Diet has been pretty good.  He has labs due with his primary care at his upcoming appointment.  He will get his cholesterol checked then.  No issues with any medications.  Blood pressures well controlled today.  Reports no shortness of breath nor dyspnea on exertion. Reports no chest pain, pressure, or tightness. No edema, orthopnea, PND. Reports no palpitations.    Past Medical History    Past Medical History:  Diagnosis Date   Apical variant hypertrophic cardiomyopathy (HCC)    History of echocardiogram    Echo 11/16: Moderate LVH, vigorous LVF, EF 55-60%, normal wall motion, grade 1 diastolic dysfunction   Hypertension    S/P cardiac catheterization    LHC 4/13: no sig CAD, EF 60%.  LVEDP was 30 mmHg.  Evidence of apical hypertrophy.   Past Surgical History:  Procedure Laterality Date   CORONARY ANGIOPLASTY     LEFT HEART CATHETERIZATION WITH CORONARY ANGIOGRAM N/A 12/08/2011   Procedure: LEFT HEART CATHETERIZATION WITH CORONARY ANGIOGRAM;  Surgeon: Corky Crafts, MD;  Location: [facility] CATH LAB;  Service: Cardiovascular;  Laterality: N/A;   LIPOMA EXCISION  2003    Allergies  Allergies   Allergen Reactions   Levofloxacin Diarrhea and Nausea And Vomiting   Penicillins Other (See Comments) and Rash    Childhood      EKGs/Labs/Other Studies Reviewed:   The following studies were reviewed today:  Echocardiogram 07/05/2015 Study Conclusions   - Left ventricle: The cavity size was normal. There was moderate    concentric hypertrophy. Systolic function was vigorous. The    estimated ejection fraction was in the range of 65% to 70%. Wall    motion was normal; there were no regional wall motion    abnormalities. Doppler parameters are consistent with abnormal    left ventricular relaxation (grade 1 diastolic dysfunction). The    E/e&' ratio is <8, suggesting normal LV filling pressure.  - Aortic valve: Transvalvular velocity was minimally increased.    There was no stenosis. There was no regurgitation.  - Left atrium: The atrium was normal in size.   Impressions:   - LVEF 65-70%, vigorous LV function, normal wall motion, diastolic    dysfunction, normal LV filling pressure, normal LA size.   Transthoracic echocardiography.  M-mode, complete 2D, spectral  Doppler, and color Doppler.  Birthdate:  Patient birthdate:  31-Mar-1951.  Age:  Patient is 70 yr old.  Sex:  Gender: male.  BMI: 29.4 kg/m^2.  Blood pressure:     142/70  Patient status:  Outpatient.  Study date:  Study date: 07/05/2015. Study time: 01:00  PM.  Location:  [facility]   -------------------------------------------------------------------   -------------------------------------------------------------------  Left ventricle:  The cavity size was normal. There was moderate  concentric hypertrophy. Systolic function was vigorous. The  estimated ejection fraction was in the range of 65% to 70%. Wall  motion was normal; there were no regional wall motion  abnormalities. Doppler parameters are consistent with abnormal left  ventricular relaxation (grade 1 diastolic dysfunction). The E/e&'  ratio  is <8, suggesting normal LV filling pressure.   -------------------------------------------------------------------  Aortic valve:   Doppler:  Transvalvular velocity was minimally  increased. There was no stenosis. There was no regurgitation.  VTI ratio of LVOT to aortic valve: 0.45. Valve area (VTI): 1.15  cm^2. Indexed valve area (VTI): 0.61 cm^2/m^2. Peak velocity ratio  of LVOT to aortic valve: 0.51. Valve area (Vmax): 1.29 cm^2.  Indexed valve area (Vmax): 0.68 cm^2/m^2. Mean velocity ratio of  LVOT to aortic valve: 0.51. Valve area (Vmean): 1.3 cm^2. Indexed  valve area (Vmean): 0.69 cm^2/m^2.    Mean gradient (S): 10 mm Hg.  Peak gradient (S): 18 mm Hg.   -------------------------------------------------------------------  Aorta: Aortic root: The aortic root was normal in size.  Ascending aorta: The ascending aorta was normal in size.   -------------------------------------------------------------------  Mitral valve:   Structurally normal valve.   Leaflet separation was  normal.  Doppler:  Transvalvular velocity was within the normal  range. There was no evidence for stenosis. There was no  regurgitation.    Peak gradient (D): 2 mm Hg.   -------------------------------------------------------------------  Left atrium:  The atrium was normal in size.   -------------------------------------------------------------------  Right ventricle:  The cavity size was normal. Wall thickness was  normal. Systolic function was normal.   -------------------------------------------------------------------  Pulmonic valve:    The valve appears to be grossly normal.  Doppler:  There was no significant regurgitation.   -------------------------------------------------------------------  Tricuspid valve:   Doppler:  There was no significant  regurgitation.   -------------------------------------------------------------------  Pulmonary artery:   The main pulmonary artery was normal-sized.    -------------------------------------------------------------------  Right atrium:  The atrium was normal in size.   -------------------------------------------------------------------  Pericardium: There was no pericardial effusion.   -------------------------------------------------------------------  Systemic veins:  Inferior vena cava: The vessel was normal in size. The  respirophasic diameter changes were in the normal range (= 50%),  consistent with normal central venous pressure.   EKG:  EKG is  ordered today.  The ekg ordered today demonstrates normal sinus rhythm, 76 bpm, T wave abnormality with LVH.  Consistent with EKG last year.  Recent Labs: No results found for requested labs within last 365 days.  Recent Lipid Panel    Component Value Date/Time   CHOL 98 (L) 11/24/2016 0740   TRIG 69 11/24/2016 0740   HDL 30 (L) 11/24/2016 0740   CHOLHDL 3.3 11/24/2016 0740   CHOLHDL 3.2 12/08/2011 1000   VLDL 9 12/08/2011 1000   LDLCALC 54 11/24/2016 0740    Home Medications   Current Meds  Medication Sig   aspirin 81 MG tablet Take 81 mg by mouth daily.   diclofenac Sodium (VOLTAREN) 1 % GEL SMARTSIG:2 Gram(s) Topical Every 8 Hours PRN   metFORMIN (GLUCOPHAGE) 500 MG tablet Take 1,000 mg by mouth 2 (two) times daily with a meal.    [DISCONTINUED] amLODipine (NORVASC) 10 MG tablet Take 1 tablet (10 mg total) by mouth daily.   [DISCONTINUED] atorvastatin (LIPITOR) 20 MG tablet Take 1 tablet (20 mg total) by mouth daily.   [DISCONTINUED] carvedilol (COREG) 12.5 MG tablet TAKE 1 TABLET BY MOUTH 2  TIMES DAILY.   [DISCONTINUED] telmisartan (MICARDIS) 80 MG tablet Take 1 tablet (80 mg total) by mouth daily. Please schedule an appt. In order to receive future refills. Thank you. 1st attempt     Review of Systems      All other systems reviewed and are otherwise negative except as noted above.  Physical Exam    VS:  BP 132/72   Pulse 73   Ht 5' 5.5" (1.664 m)   Wt 167 lb  (75.8 kg)   SpO2 98%   BMI 27.37 kg/m  , BMI Body mass index is 27.37 kg/m.  Wt Readings from Last 3 Encounters:  05/04/22 167 lb (75.8 kg)  03/24/21 169 lb 12.8 oz (77 kg)  09/30/19 167 lb (75.8 kg)     GEN: Well nourished, well developed, in no acute distress. HEENT: normal. Neck: Supple, no JVD, carotid bruits, or masses. Cardiac: RRR, + 3/6 systolic murmur, rubs, or gallops. No clubbing, cyanosis, 1 + pitting bilateral R > L edema.  Radials/PT 2+ and equal bilaterally.  Respiratory:  Respirations regular and unlabored, clear to auscultation bilaterally. GI: Soft, nontender, nondistended. MS: No deformity or atrophy. Skin: Warm and dry, no rash. Neuro:  Strength and sensation are intact. Psych: Normal affect.  Assessment & Plan    Apical hypertrophy -some dependent edema today -no SOB -will update echo -murmur present on exam  2. Diabetes mellitus type 2 -primary plans to do labs at next appointment -A1c 6.3  -Continue metformin  3.  Hypertension -well controlled today -Continue Norvasc 10 mg daily, Coreg 12.5 mg twice a day, telmisartan 80 mg daily  4.  Chronic renal insufficiency -primary plans to do labs at upcoming appointment -avoid nephrotoxic medications  6. Abnormal ECG -Stable.  T wave abnormalities with LVH which is consistent with last year's EKG      Disposition: Follow up 1 year with Lance Muss, MD or APP.  Signed, Sharlene Dory, PA-C 05/04/2022, 9:17 AM [facility]

## 2022-05-04 ENCOUNTER — Encounter: Payer: Self-pay | Admitting: Physician Assistant

## 2022-05-04 ENCOUNTER — Ambulatory Visit: Payer: Medicare HMO | Attending: Physician Assistant | Admitting: Physician Assistant

## 2022-05-04 VITALS — BP 132/72 | HR 73 | Ht 65.5 in | Wt 167.0 lb

## 2022-05-04 DIAGNOSIS — E119 Type 2 diabetes mellitus without complications: Secondary | ICD-10-CM

## 2022-05-04 DIAGNOSIS — I10 Essential (primary) hypertension: Secondary | ICD-10-CM

## 2022-05-04 DIAGNOSIS — N189 Chronic kidney disease, unspecified: Secondary | ICD-10-CM | POA: Diagnosis not present

## 2022-05-04 DIAGNOSIS — R011 Cardiac murmur, unspecified: Secondary | ICD-10-CM | POA: Diagnosis not present

## 2022-05-04 DIAGNOSIS — R9431 Abnormal electrocardiogram [ECG] [EKG]: Secondary | ICD-10-CM

## 2022-05-04 DIAGNOSIS — I422 Other hypertrophic cardiomyopathy: Secondary | ICD-10-CM | POA: Diagnosis not present

## 2022-05-04 MED ORDER — CARVEDILOL 12.5 MG PO TABS: 12.5000 mg | ORAL_TABLET | Freq: Two times a day (BID) | ORAL | 3 refills | Status: DC

## 2022-05-04 MED ORDER — ATORVASTATIN CALCIUM 20 MG PO TABS
20.0000 mg | ORAL_TABLET | Freq: Every day | ORAL | 3 refills | Status: DC
Start: 2022-05-04 — End: 2023-04-20

## 2022-05-04 MED ORDER — TELMISARTAN 80 MG PO TABS
80.0000 mg | ORAL_TABLET | Freq: Every day | ORAL | 3 refills | Status: DC
Start: 2022-05-04 — End: 2023-04-20

## 2022-05-04 MED ORDER — AMLODIPINE BESYLATE 10 MG PO TABS: 10.0000 mg | ORAL_TABLET | Freq: Every day | ORAL | 3 refills | Status: DC

## 2022-05-04 NOTE — Patient Instructions (Signed)
Medication Instructions:  Your physician recommends that you continue on your current medications as directed. Please refer to the Current Medication list given to you today.  *If you need a refill on your cardiac medications before your next appointment, please call your pharmacy*   Lab Work: Have your primary care provider draw a lipid panel when you see them If you have labs (blood work) drawn today and your tests are completely normal, you will receive your results only by: MyChart Message (if you have MyChart) OR A paper copy in the mail If you have any lab test that is abnormal or we need to change your treatment, we will call you to review the results.   Testing/Procedures: Your physician has requested that you have an echocardiogram. Echocardiography is a painless test that uses sound waves to create images of your heart. It provides your doctor with information about the size and shape of your heart and how well your heart's chambers and valves are working. This procedure takes approximately one hour. There are no restrictions for this procedure.    Follow-Up: At [facility], you and your health needs are our priority.  As part of our continuing mission to provide you with exceptional heart care, we have created designated Provider Care Teams.  These Care Teams include your primary Cardiologist (physician) and Advanced Practice Providers (APPs -  Physician Assistants and Nurse Practitioners) who all work together to provide you with the care you need, when you need it.  We recommend signing up for the patient portal called "MyChart".  Sign up information is provided on this After Visit Summary.  MyChart is used to connect with patients for Virtual Visits (Telemedicine).  Patients are able to view lab/test results, encounter notes, upcoming appointments, etc.  Non-urgent messages can be sent to your provider as well.   To learn more about what you can do with MyChart, go to  [email or web address].    Your next appointment:   1 year(s)  The format for your next appointment:   In Person  Provider:   Lance Muss, MD    Important Information About Sugar

## 2022-05-19 ENCOUNTER — Ambulatory Visit (HOSPITAL_COMMUNITY): Payer: Medicare HMO | Attending: Physician Assistant

## 2022-05-19 DIAGNOSIS — R011 Cardiac murmur, unspecified: Secondary | ICD-10-CM | POA: Diagnosis present

## 2022-05-19 LAB — ECHOCARDIOGRAM COMPLETE
Area-P 1/2: 3.21 cm2
S' Lateral: 2.6 cm

## 2023-04-20 ENCOUNTER — Other Ambulatory Visit: Payer: Self-pay | Admitting: Physician Assistant

## 2023-04-27 ENCOUNTER — Other Ambulatory Visit: Payer: Self-pay | Admitting: Physician Assistant

## 2023-04-27 NOTE — Telephone Encounter (Signed)
Patient of Dr. Varanasi. Please review for refill. Thank you!  

## 2023-05-13 ENCOUNTER — Other Ambulatory Visit: Payer: Self-pay | Admitting: Interventional Cardiology

## 2023-05-17 ENCOUNTER — Other Ambulatory Visit: Payer: Self-pay | Admitting: Interventional Cardiology

## 2023-05-22 ENCOUNTER — Other Ambulatory Visit: Payer: Self-pay | Admitting: Interventional Cardiology

## 2023-06-02 ENCOUNTER — Other Ambulatory Visit: Payer: Self-pay | Admitting: Interventional Cardiology

## 2023-06-11 ENCOUNTER — Ambulatory Visit: Payer: Medicare HMO | Attending: Physician Assistant | Admitting: Physician Assistant

## 2023-06-11 ENCOUNTER — Encounter: Payer: Self-pay | Admitting: Physician Assistant

## 2023-06-11 VITALS — BP 144/76 | HR 78 | Ht 65.5 in | Wt 169.0 lb

## 2023-06-11 DIAGNOSIS — E119 Type 2 diabetes mellitus without complications: Secondary | ICD-10-CM | POA: Diagnosis not present

## 2023-06-11 DIAGNOSIS — I422 Other hypertrophic cardiomyopathy: Secondary | ICD-10-CM

## 2023-06-11 DIAGNOSIS — I10 Essential (primary) hypertension: Secondary | ICD-10-CM | POA: Diagnosis not present

## 2023-06-11 DIAGNOSIS — D649 Anemia, unspecified: Secondary | ICD-10-CM

## 2023-06-11 DIAGNOSIS — N189 Chronic kidney disease, unspecified: Secondary | ICD-10-CM

## 2023-06-11 DIAGNOSIS — R9431 Abnormal electrocardiogram [ECG] [EKG]: Secondary | ICD-10-CM

## 2023-06-11 NOTE — Patient Instructions (Signed)
Medication Instructions:  Your physician recommends that you continue on your current medications as directed. Please refer to the Current Medication list given to you today.  *If you need a refill on your cardiac medications before your next appointment, please call your pharmacy*  Lab Work: None ordered If you have labs (blood work) drawn today and your tests are completely normal, you will receive your results only by: MyChart Message (if you have MyChart) OR A paper copy in the mail If you have any lab test that is abnormal or we need to change your treatment, we will call you to review the results.  Follow-Up: At [facility], you and your health needs are our priority.  As part of our continuing mission to provide you with exceptional heart care, we have created designated Provider Care Teams.  These Care Teams include your primary Cardiologist (physician) and Advanced Practice Providers (APPs -  Physician Assistants and Nurse Practitioners) who all work together to provide you with the care you need, when you need it.  We recommend signing up for the patient portal called "MyChart".  Sign up information is provided on this After Visit Summary.  MyChart is used to connect with patients for Virtual Visits (Telemedicine).  Patients are able to view lab/test results, encounter notes, upcoming appointments, etc.  Non-urgent messages can be sent to your provider as well.   To learn more about what you can do with MyChart, go to [email or web address].    Your next appointment:   1 year(s)  Provider:   Dr Lynnette Caffey  Other Instructions Check your blood pressure daily, 1 hr after morning medications for 2 weeks, keep a log and send [place] the readings through mychart at the end of the 2 weeks.   Heart-Healthy Eating Plan Many factors influence your heart health, including eating and exercise habits. Heart health is also called coronary health. Coronary risk increases with abnormal blood  fat (lipid) levels. A heart-healthy eating plan includes limiting unhealthy fats, increasing healthy fats, limiting salt (sodium) intake, and making other diet and lifestyle changes. What is my plan? Your health care provider may recommend that: You limit your fat intake to _________% or less of your total calories each day. You limit your saturated fat intake to _________% or less of your total calories each day. You limit the amount of cholesterol in your diet to less than _________ mg per day. You limit the amount of sodium in your diet to less than _________ mg per day. What are tips for following this plan? Cooking Cook foods using methods other than frying. Baking, boiling, grilling, and broiling are all good options. Other ways to reduce fat include: Removing the skin from poultry. Removing all visible fats from meats. Steaming vegetables in water or broth. Meal planning  At meals, imagine dividing your plate into fourths: Fill one-half of your plate with vegetables and green salads. Fill one-fourth of your plate with whole grains. Fill one-fourth of your plate with lean protein foods. Eat 2-4 cups of vegetables per day. One cup of vegetables equals 1 cup (91 g) broccoli or cauliflower florets, 2 medium carrots, 1 large bell pepper, 1 large sweet potato, 1 large tomato, 1 medium white potato, 2 cups (150 g) raw leafy greens. Eat 1-2 cups of fruit per day. One cup of fruit equals 1 small apple, 1 large banana, 1 cup (237 g) mixed fruit, 1 large orange,  cup (82 g) dried fruit, 1 cup (240 mL) 100% fruit juice.  Eat more foods that contain soluble fiber. Examples include apples, broccoli, carrots, beans, peas, and barley. Aim to get 25-30 g of fiber per day. Increase your consumption of legumes, nuts, and seeds to 4-5 servings per week. One serving of dried beans or legumes equals  cup (90 g) cooked, 1 serving of nuts is  oz (12 almonds, 24 pistachios, or 7 walnut halves), and 1  serving of seeds equals  oz (8 g). Fats Choose healthy fats more often. Choose monounsaturated and polyunsaturated fats, such as olive and canola oils, avocado oil, flaxseeds, walnuts, almonds, and seeds. Eat more omega-3 fats. Choose salmon, mackerel, sardines, tuna, flaxseed oil, and ground flaxseeds. Aim to eat fish at least 2 times each week. Check food labels carefully to identify foods with trans fats or high amounts of saturated fat. Limit saturated fats. These are found in animal products, such as meats, butter, and cream. Plant sources of saturated fats include palm oil, palm kernel oil, and coconut oil. Avoid foods with partially hydrogenated oils in them. These contain trans fats. Examples are stick margarine, some tub margarines, cookies, crackers, and other baked goods. Avoid fried foods. General information Eat more home-cooked food and less restaurant, buffet, and fast food. Limit or avoid alcohol. Limit foods that are high in added sugar and simple starches such as foods made using white refined flour (white breads, pastries, sweets). Lose weight if you are overweight. Losing just 5-10% of your body weight can help your overall health and prevent diseases such as diabetes and heart disease. Monitor your sodium intake, especially if you have high blood pressure. Talk with your health care provider about your sodium intake. Try to incorporate more vegetarian meals weekly. What foods should I eat? Fruits All fresh, canned (in natural juice), or frozen fruits. Vegetables Fresh or frozen vegetables (raw, steamed, roasted, or grilled). Green salads. Grains Most grains. Choose whole wheat and whole grains most of the time. Rice and pasta, including brown rice and pastas made with whole wheat. Meats and other proteins Lean, well-trimmed beef, veal, pork, and lamb. Chicken and [place] without skin. All fish and shellfish. Wild duck, rabbit, pheasant, and venison. Egg whites or  low-cholesterol egg substitutes. Dried beans, peas, lentils, and tofu. Seeds and most nuts. Dairy Low-fat or nonfat cheeses, including ricotta and mozzarella. Skim or 1% milk (liquid, powdered, or evaporated). Buttermilk made with low-fat milk. Nonfat or low-fat yogurt. Fats and oils Non-hydrogenated (trans-free) margarines. Vegetable oils, including soybean, sesame, sunflower, olive, avocado, peanut, safflower, corn, canola, and cottonseed. Salad dressings or mayonnaise made with a vegetable oil. Beverages Water (mineral or sparkling). Coffee and tea. Unsweetened ice tea. Diet beverages. Sweets and desserts Sherbet, gelatin, and fruit ice. Small amounts of dark chocolate. Limit all sweets and desserts. Seasonings and condiments All seasonings and condiments. The items listed above may not be a complete list of foods and beverages you can eat. Contact a dietitian for more options. What foods should I avoid? Fruits Canned fruit in heavy syrup. Fruit in cream or butter sauce. Fried fruit. Limit coconut. Vegetables Vegetables cooked in cheese, cream, or butter sauce. Fried vegetables. Grains Breads made with saturated or trans fats, oils, or whole milk. Croissants. Sweet rolls. Donuts. High-fat crackers, such as cheese crackers and chips. Meats and other proteins Fatty meats, such as hot dogs, ribs, sausage, bacon, rib-eye roast or steak. High-fat deli meats, such as salami and bologna. Caviar. Domestic duck and goose. Organ meats, such as liver. Dairy Cream, sour cream, cream  cheese, and creamed cottage cheese. Whole-milk cheeses. Whole or 2% milk (liquid, evaporated, or condensed). Whole buttermilk. Cream sauce or high-fat cheese sauce. Whole-milk yogurt. Fats and oils Meat fat, or shortening. Cocoa butter, hydrogenated oils, palm oil, coconut oil, palm kernel oil. Solid fats and shortenings, including bacon fat, salt pork, lard, and butter. Nondairy cream substitutes. Salad dressings with  cheese or sour cream. Beverages Regular sodas and any drinks with added sugar. Sweets and desserts Frosting. Pudding. Cookies. Cakes. Pies. Milk chocolate or white chocolate. Buttered syrups. Full-fat ice cream or ice cream drinks. The items listed above may not be a complete list of foods and beverages to avoid. Contact a dietitian for more information. Summary Heart-healthy meal planning includes limiting unhealthy fats, increasing healthy fats, limiting salt (sodium) intake and making other diet and lifestyle changes. Lose weight if you are overweight. Losing just 5-10% of your body weight can help your overall health and prevent diseases such as diabetes and heart disease. Focus on eating a balance of foods, including fruits and vegetables, low-fat or nonfat dairy, lean protein, nuts and legumes, whole grains, and heart-healthy oils and fats. This information is not intended to replace advice given to you by your health care provider. Make sure you discuss any questions you have with your health care provider. Document Revised: 09/26/2021 Document Reviewed: 09/26/2021 Elsevier Patient Education  2024 [employer] Low-Sodium Eating Plan Salt (sodium) helps you keep a healthy balance of fluids in your body. Too much sodium can raise your blood pressure. It can also cause fluid and waste to be held in your body. Your health care provider or dietitian may recommend a low-sodium eating plan if you have high blood pressure (hypertension), kidney disease, liver disease, or heart failure. Eating less sodium can help lower your blood pressure and reduce swelling. It can also protect your heart, liver, and kidneys. What are tips for following this plan? Reading food labels  Check food labels for the amount of sodium per serving. If you eat more than one serving, you must multiply the listed amount by the number of servings. Choose foods with less than 140 milligrams (mg) of sodium per serving. Avoid  foods with 300 mg of sodium or more per serving. Always check how much sodium is in a product, even if the label says "unsalted" or "no salt added." Shopping  Buy products labeled as "low-sodium" or "no salt added." Buy fresh foods. Avoid canned foods and pre-made or frozen meals. Avoid canned, cured, or processed meats. Buy breads that have less than 80 mg of sodium per slice. Cooking  Eat more home-cooked food. Try to eat less restaurant, buffet, and fast food. Try not to add salt when you cook. Use salt-free seasonings or herbs instead of table salt or sea salt. Check with your provider or pharmacist before using salt substitutes. Cook with plant-based oils, such as canola, sunflower, or olive oil. Meal planning When eating at a restaurant, ask if your food can be made with less salt or no salt. Avoid dishes labeled as brined, pickled, cured, or smoked. Avoid dishes made with soy sauce, miso, or teriyaki sauce. Avoid foods that have monosodium glutamate (MSG) in them. MSG may be added to some restaurant food, sauces, soups, bouillon, and canned foods. Make meals that can be grilled, baked, poached, roasted, or steamed. These are often made with less sodium. General information Try to limit your sodium intake to 1,500-2,300 mg each day, or the amount told by your provider. What  foods should I eat? Fruits Fresh, frozen, or canned fruit. Fruit juice. Vegetables Fresh or frozen vegetables. "No salt added" canned vegetables. "No salt added" tomato sauce and paste. Low-sodium or reduced-sodium tomato and vegetable juice. Grains Low-sodium cereals, such as oats, puffed wheat and rice, and shredded wheat. Low-sodium crackers. Unsalted rice. Unsalted pasta. Low-sodium bread. Whole grain breads and whole grain pasta. Meats and other proteins Fresh or frozen meat, poultry, seafood, and fish. These should have no added salt. Low-sodium canned tuna and salmon. Unsalted nuts. Dried peas, beans, and  lentils without added salt. Unsalted canned beans. Eggs. Unsalted nut butters. Dairy Milk. Soy milk. Cheese that is naturally low in sodium, such as ricotta cheese, fresh mozzarella, or Swiss cheese. Low-sodium or reduced-sodium cheese. Cream cheese. Yogurt. Seasonings and condiments Fresh and dried herbs and spices. Salt-free seasonings. Low-sodium mustard and ketchup. Sodium-free salad dressing. Sodium-free light mayonnaise. Fresh or refrigerated horseradish. Lemon juice. Vinegar. Other foods Homemade, reduced-sodium, or low-sodium soups. Unsalted popcorn and pretzels. Low-salt or salt-free chips. The items listed above may not be all the foods and drinks you can have. Talk to a dietitian to learn more. What foods should I avoid? Vegetables Sauerkraut, pickled vegetables, and relishes. Olives. [place] fries. Onion rings. Regular canned vegetables, except low-sodium or reduced-sodium items. Regular canned tomato sauce and paste. Regular tomato and vegetable juice. Frozen vegetables in sauces. Grains Instant hot cereals. Bread stuffing, pancake, and biscuit mixes. Croutons. Seasoned rice or pasta mixes. Noodle soup cups. Boxed or frozen macaroni and cheese. Regular salted crackers. Self-rising flour. Meats and other proteins Meat or fish that is salted, canned, smoked, spiced, or pickled. Precooked or cured meat, such as sausages or meat loaves. Tomasa Blase. Ham. Pepperoni. Hot dogs. Corned beef. Chipped beef. Salt pork. Jerky. Pickled herring, anchovies, and sardines. Regular canned tuna. Salted nuts. Dairy Processed cheese and cheese spreads. Hard cheeses. Cheese curds. Blue cheese. Feta cheese. String cheese. Regular cottage cheese. Buttermilk. Canned milk. Fats and oils Salted butter. Regular margarine. Ghee. Bacon fat. Seasonings and condiments Onion salt, garlic salt, seasoned salt, table salt, and sea salt. Canned and packaged gravies. Worcestershire sauce. Tartar sauce. Barbecue sauce. Teriyaki  sauce. Soy sauce, including reduced-sodium soy sauce. Steak sauce. Fish sauce. Oyster sauce. Cocktail sauce. Horseradish that you find on the shelf. Regular ketchup and mustard. Meat flavorings and tenderizers. Bouillon cubes. Hot sauce. Pre-made or packaged marinades. Pre-made or packaged taco seasonings. Relishes. Regular salad dressings. Salsa. Other foods Salted popcorn and pretzels. Corn chips and puffs. Potato and tortilla chips. Canned or dried soups. Pizza. Frozen entrees and pot pies. The items listed above may not be all the foods and drinks you should avoid. Talk to a dietitian to learn more. This information is not intended to replace advice given to you by your health care provider. Make sure you discuss any questions you have with your health care provider. Document Revised: 09/07/2022 Document Reviewed: 09/07/2022 Elsevier Patient Education  2024 [employer].

## 2023-06-11 NOTE — Progress Notes (Signed)
Cardiology Office Note:  .   Date:  06/11/2023  ID:  Estanislado Spire, DOB 01-Aug-1951, MRN [id number] PCP: Iona Hansen, NP  [facility] Providers Cardiologist:  Lance Muss, MD {  History of Present Illness: Sean Pittman   Kevian Burrola is a 72 y.o. male with a PMH of apical hypertrophy and HTN here for follow-up appointment.   He had a cardaic cath for a concerning EKG 12/2011 which showed:   Normal left main coronary artery. No significant atherosclerosis in the left anterior descending artery and its branches. Normal left circumflex artery and its branches. Normal right coronary artery. Normal left ventricular systolic function.  LVEDP 30 mmHg.  Ejection fraction 60%  He retired in 2013 from the [employer].   Today, he tells me he is doing well from a cardiac standpoint.  No chest pains, shortness of breath, or palpitations.  He does some walking a couple of days a week about 10 minutes.  Unfortunately, his legs get weak and he has to stop and rest.  Diet has been pretty good.  No swelling in his legs.  We have taking care of some refills of his medicines today including his micardis and Lipitor.  Reports no shortness of breath nor dyspnea on exertion. Reports no chest pain, pressure, or tightness. No edema, orthopnea, PND. Reports no palpitations.    ROS: Pertinent ROS in HPI  Studies Reviewed: Sean Pittman   EKG Interpretation Date/Time:  Monday June 11 2023 14:09:31 EDT Ventricular Rate:  71 PR Interval:  164 QRS Duration:  76 QT Interval:  378 QTC Calculation: 410 R Axis:   13  Text Interpretation: Normal sinus rhythm T wave abnormality, consider lateral ischemia When compared with ECG of 10-Apr-2020 16:44, PREVIOUS ECG IS PRESENT Confirmed by Jari Favre [phone]) on 06/11/2023 2:15:35 PM   Echo 05/19/2022 IMPRESSIONS     1. Left ventricular ejection fraction, by estimation, is 60 to 65%. Left  ventricular ejection fraction by 3D volume is 62 %. The left ventricle has   normal function. The left ventricle has no regional wall motion  abnormalities. There is mild concentric  left ventricular hypertrophy. Left ventricular diastolic parameters are  consistent with Grade I diastolic dysfunction (impaired relaxation). The  average left ventricular global longitudinal strain is -18.7 %. The global  longitudinal strain is normal.   2. Right ventricular systolic function is normal. The right ventricular  size is normal.   3. The mitral valve is normal in structure. No evidence of mitral valve  regurgitation. No evidence of mitral stenosis.   4. The aortic valve is tricuspid. There is mild thickening of the aortic  valve. Aortic valve regurgitation is not visualized. Aortic valve  sclerosis is present, with no evidence of aortic valve stenosis.   5. The inferior vena cava is normal in size with greater than 50%  respiratory variability, suggesting right atrial pressure of 3 mmHg.   FINDINGS   Left Ventricle: Left ventricular ejection fraction, by estimation, is 60  to 65%. Left ventricular ejection fraction by 3D volume is 62 %. The left  ventricle has normal function. The left ventricle has no regional wall  motion abnormalities. The average  left ventricular global longitudinal strain is -18.7 %. The global  longitudinal strain is normal. The left ventricular internal cavity size  was normal in size. There is mild concentric left ventricular hypertrophy.  Left ventricular diastolic parameters  are consistent with Grade I diastolic dysfunction (impaired relaxation).  Indeterminate filling pressures.  Right Ventricle: The right ventricular size is normal. No increase in  right ventricular wall thickness. Right ventricular systolic function is  normal.   Left Atrium: Left atrial size was normal in size.   Right Atrium: Right atrial size was normal in size.   Pericardium: There is no evidence of pericardial effusion.   Mitral Valve: The mitral valve is  normal in structure. No evidence of  mitral valve regurgitation. No evidence of mitral valve stenosis.   Tricuspid Valve: The tricuspid valve is normal in structure. Tricuspid  valve regurgitation is not demonstrated. No evidence of tricuspid  stenosis.   Aortic Valve: The aortic valve is tricuspid. There is mild thickening of  the aortic valve. Aortic valve regurgitation is not visualized. Aortic  valve sclerosis is present, with no evidence of aortic valve stenosis.   Pulmonic Valve: The pulmonic valve was normal in structure. Pulmonic valve  regurgitation is not visualized. No evidence of pulmonic stenosis.   Aorta: The aortic root is normal in size and structure.   Venous: The inferior vena cava is normal in size with greater than 50%  respiratory variability, suggesting right atrial pressure of 3 mmHg.   IAS/Shunts: No atrial level shunt detected by color flow Doppler.       Physical Exam:   VS:  BP (!) 144/76   Pulse 78   Ht 5' 5.5" (1.664 m)   Wt 169 lb (76.7 kg)   SpO2 98%   BMI 27.70 kg/m    Wt Readings from Last 3 Encounters:  06/11/23 169 lb (76.7 kg)  05/04/22 167 lb (75.8 kg)  03/24/21 169 lb 12.8 oz (77 kg)    GEN: Well nourished, well developed in no acute distress NECK: No JVD; No carotid bruits CARDIAC: RRR, + systolic murmurs, rubs, gallops RESPIRATORY:  Clear to auscultation without rales, wheezing or rhonchi  ABDOMEN: Soft, non-tender, non-distended EXTREMITIES:  trace bilateral edema; No deformity   ASSESSMENT AND PLAN: .   Apical hypertrophy -Euvolemic on exam today -He had recent echocardiogram -Murmur present on exam  DM2 -A1c 6.7 -Continue metformin -Follow-up with PCP  Abnormal EKG -Stable T wave abnormalities with LVH which is consistent with last year's EKG  HTN -Slightly elevated today, 144/76 -Recommend continuing current medications and tracking blood pressure at home -He tells me is usually have any gets the doctor but when he  checks at home it is much lower  CRI -creatinine 1.5  -Avoiding nephrotoxic medications   Dispo: He will f/u in 1 year with Dr. Lynnette Caffey  Signed, Sharlene Dory, PA-C

## 2023-06-24 ENCOUNTER — Other Ambulatory Visit: Payer: Self-pay | Admitting: Interventional Cardiology

## 2023-06-27 ENCOUNTER — Other Ambulatory Visit: Payer: Self-pay | Admitting: Interventional Cardiology

## 2023-11-22 LAB — COLOGUARD: COLOGUARD: NEGATIVE

## 2023-11-28 NOTE — Progress Notes (Signed)
 Nurse Visit BP Template   PCP: Santana Molt, FNP-BC Blood Pressure Today: 150/70 Second Blood Pressure Today: 160/86 HTN Meds: Telmisartan  80 MG Amlodipine  10 MG Took Meds Today: Yes Checking BP at home: Yes, Range-136/75 - 120/70 Any Symptoms: None Next Follow up Visit: 03/17/2024 @ 8:40 AM. Continue current regimen: Yes Changes made by Provider: Provider or Medical Assistant will contact patient with any changes to BP regimen.  Patient also, presented to the office for weight check. At today's visit patient current weight was 171.6 lbs.

## 2023-12-22 ENCOUNTER — Other Ambulatory Visit: Payer: Self-pay | Admitting: Interventional Cardiology

## 2024-06-11 NOTE — Progress Notes (Signed)
 Cardiology Office Note:   Date:  06/17/2024  ID:  Sean Pittman, DOB December 25, 1950, MRN [id number] PCP:  Joshua Santana CROME, NP  [facility] Providers Cardiologist:  Wendel Haws, MD Referring MD: Joshua Santana CROME, NP  Chief Complaint/Reason for Referral: Follow-up hypertension ASSESSMENT:    1. Diastolic dysfunction   2. Type 2 diabetes mellitus without complication, without long-term current use of insulin (HCC)   3. Hyperlipidemia LDL goal <70   4. Essential hypertension   5. Stage 3a chronic kidney disease (HCC)   6. BMI 27.0-27.9,adult     PLAN:   In order of problems listed above: Diastolic dysfunction: Continue Coreg  12.5 mg twice daily, telmisartan  80 mg daily, start Jardiance 10 mg daily T2DM: Continue aspirin  81 mg, atorvastatin  20 mg, telmisartan  80 mg, start Jardiance 10 mg daily Hyperlipidemia: Continue atorvastatin  20 mg.  Check lipid panel, LFTs, LP(a) today Hypertension: Continue amlodipine  10 mg, Coreg  12.5 mg twice daily, telmisartan  80 mg daily.  BP is well-controlled today CKD stage IIIa: Continue telmisartan  80 mg, start Jardiance 10 mg Elevated BMI: Will refer to Pharm.D. for recommendation regarding GLP-1 receptor agonist therapy.            Dispo:  Return in about 1 year (around 06/17/2025).       I spent 35 minutes reviewing all clinical data during and prior to this visit including all relevant imaging studies, laboratories, clinical information from other health systems and prior notes from both Cardiology and other specialties, interviewing the patient, conducting a complete physical examination, and coordinating care in order to formulate a comprehensive and personalized evaluation and treatment plan.   History of Present Illness:    FOCUSED PROBLEM LIST:   Chest pain syndrome  No obstructive disease cath 2013 Diastolic dysfunction Mild LVH, G1 DD, no significant valve issues, EF 60 to 65% TTE 2023 T2DM Not on  insulin Hyperlipidemia Hypertension CKD stage IIIa BMI 30 June 2024:  Patient consents to use of AI scribe. Patient is 73 year old male here for routine cardiology follow-up.  He was last seen a year ago and was doing well.  His blood pressure was above goal and no changes were made to his medical regimen.  No current issues with breathing, and no shortness of breath or chest pain, attributing occasional discomfort to heartburn. He is able to walk and perform daily activities without difficulty.  He has high blood pressure and high cholesterol, managed with multiple medications, including aspirin . His blood pressure is often high but he does not report any issues with his current medications.  He experiences swelling in his feet, which improves with elevation. Compression stockings were tried but caused discomfort, so he uses short socks with cuts on the sides for comfort. His feet are better in the morning, and there is no pain associated with the swelling.  His past medical history includes a heart catheterization that showed normal arteries and an ultrasound in 2023 indicating a thickened heart muscle due to high blood pressure. 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  No issues with laying flat in bed or breathing difficulties at night.        Current Medications: Current Meds  Medication Sig   amLODipine  (NORVASC ) 10 MG tablet TAKE 1 TABLET BY MOUTH EVERY DAY   aspirin  81 MG tablet Take 81 mg by mouth daily.   atorvastatin  (LIPITOR) 20 MG tablet  TAKE 1 TABLET BY MOUTH EVERY DAY   carvedilol  (COREG ) 12.5 MG tablet TAKE 1 TABLET BY MOUTH TWICE A DAY   diclofenac  Sodium (VOLTAREN ) 1 % GEL SMARTSIG:2 Gram(s) Topical Every 8 Hours PRN   empagliflozin (JARDIANCE) 10 MG TABS tablet Take 1 tablet (10 mg total) by mouth daily before breakfast.   metFORMIN (GLUCOPHAGE) 500 MG tablet  Take 1,000 mg by mouth 2 (two) times daily with a meal.    telmisartan  (MICARDIS ) 80 MG tablet TAKE 1 TABLET BY MOUTH EVERY DAY     Review of Systems:   Please see the history of present illness.    All other systems reviewed and are negative.     EKGs/Labs/Other Test Reviewed:   EKG: Normal sinus rhythm, lateral T wave abnormality  EKG Interpretation Date/Time:  Tuesday June 17 2024 09:49:36 EDT Ventricular Rate:  70 PR Interval:  158 QRS Duration:  76 QT Interval:  390 QTC Calculation: 421 R Axis:   5  Text Interpretation: Normal sinus rhythm T wave abnormality, consider lateral ischemia When compared with ECG of 11-Jun-2023 14:09, No significant change was found Confirmed by Wendel Haws ([id number]) on 06/17/2024 9:55:59 AM        CARDIAC STUDIES: Refer to CV Procedures and Imaging Tabs   Risk Assessment/Calculations:          Physical Exam:   VS:  BP 120/65   Pulse 72   Ht 5' 5 (1.651 m)   Wt 168 lb 3.2 oz (76.3 kg)   SpO2 98%   BMI 27.99 kg/m        Wt Readings from Last 3 Encounters:  06/17/24 168 lb 3.2 oz (76.3 kg)  06/11/23 169 lb (76.7 kg)  05/04/22 167 lb (75.8 kg)      GENERAL:  No apparent distress, AOx3 HEENT:  No carotid bruits, +2 carotid impulses, no scleral icterus CAR: RRR no murmurs, gallops, rubs, or thrills RES:  Clear to auscultation bilaterally ABD:  Soft, nontender, nondistended, positive bowel sounds x 4 VASC:  +2 radial pulses, +2 carotid pulses NEURO:  CN 2-12 grossly intact; motor and sensory grossly intact PSYCH:  No active depression or anxiety EXT:  No edema, ecchymosis, or cyanosis  Signed, Haws MARLA Wendel, MD  06/17/2024 10:25 AM    [facility] [address] [place], [place], [place]  [address] Phone: [phone]; Fax: [phone]   Note:  This document was prepared using Dragon voice recognition software and may include unintentional dictation errors.

## 2024-06-17 ENCOUNTER — Telehealth: Payer: Self-pay | Admitting: Internal Medicine

## 2024-06-17 ENCOUNTER — Encounter: Payer: Self-pay | Admitting: Internal Medicine

## 2024-06-17 ENCOUNTER — Other Ambulatory Visit (HOSPITAL_COMMUNITY): Payer: Self-pay

## 2024-06-17 ENCOUNTER — Telehealth: Payer: Self-pay | Admitting: Pharmacy Technician

## 2024-06-17 ENCOUNTER — Ambulatory Visit: Attending: Internal Medicine | Admitting: Internal Medicine

## 2024-06-17 VITALS — BP 120/65 | HR 72 | Ht 65.0 in | Wt 168.2 lb

## 2024-06-17 DIAGNOSIS — I5189 Other ill-defined heart diseases: Secondary | ICD-10-CM

## 2024-06-17 DIAGNOSIS — E785 Hyperlipidemia, unspecified: Secondary | ICD-10-CM | POA: Diagnosis not present

## 2024-06-17 DIAGNOSIS — I10 Essential (primary) hypertension: Secondary | ICD-10-CM | POA: Diagnosis not present

## 2024-06-17 DIAGNOSIS — Z6827 Body mass index (BMI) 27.0-27.9, adult: Secondary | ICD-10-CM

## 2024-06-17 DIAGNOSIS — E119 Type 2 diabetes mellitus without complications: Secondary | ICD-10-CM

## 2024-06-17 DIAGNOSIS — N1831 Chronic kidney disease, stage 3a: Secondary | ICD-10-CM

## 2024-06-17 MED ORDER — EMPAGLIFLOZIN 10 MG PO TABS: 10.0000 mg | ORAL_TABLET | Freq: Every day | ORAL | 3 refills | Status: DC

## 2024-06-17 NOTE — Patient Instructions (Signed)
 Medication Instructions:  START Jardiance 10 mg once daily   *If you need a refill on your cardiac medications before your next appointment, please call your pharmacy*  Lab Work: To be completed today: lipid panel, LFT, and lipoprotein-A  If you have labs (blood work) drawn today and your tests are completely normal, you will receive your results only by: MyChart Message (if you have MyChart) OR A paper copy in the mail If you have any lab test that is abnormal or we need to change your treatment, we will call you to review the results.  Testing/Procedures: None ordered today.  Follow-Up: At [facility], you and your health needs are our priority.  As part of our continuing mission to provide you with exceptional heart care, our providers are all part of one team.  This team includes your primary Cardiologist (physician) and Advanced Practice Providers or APPs (Physician Assistants and Nurse Practitioners) who all work together to provide you with the care you need, when you need it.  Your next appointment:   1 year(s)  Provider:   Orren Fabry, PA-C    We recommend signing up for the patient portal called MyChart.  Sign up information is provided on this After Visit Summary.  MyChart is used to connect with patients for Virtual Visits (Telemedicine).  Patients are able to view lab/test results, encounter notes, upcoming appointments, etc.  Non-urgent messages can be sent to your provider as well.   To learn more about what you can do with MyChart, go to [email or web address].   Other Instructions You have been referred to our PharmD team. Someone from our office will call you to schedule this appointment.

## 2024-06-17 NOTE — Telephone Encounter (Signed)
 Patient Advocate Encounter   The patient was approved for a [employer] grant that will help cover the cost of Jardiance BRAND Total amount awarded, 7500.00.  Effective: 05/18/24 - 05/17/25   APW:[id number] ERW:[id number] Hmnle:[id number] PI:[id number]  [employer] ID: [id number]   Pharmacy provided with approval and processing information. Patient informed via call

## 2024-06-17 NOTE — Telephone Encounter (Signed)
 Pt c/o medication issue:  1. Name of Medication: empagliflozin (JARDIANCE) 10 MG TABS tablet   2. How are you currently taking this medication (dosage and times per day)? None  3. Are you having a reaction (difficulty breathing--STAT)? No   4. What is your medication issue? Pt states he was prescribed medication at todays visit 10/14, but it is too expensive even with insurance. Pt wants to know if there is any assistance for Rx or alternatives. Please advise

## 2024-06-18 ENCOUNTER — Other Ambulatory Visit: Payer: Self-pay | Admitting: Internal Medicine

## 2024-06-18 ENCOUNTER — Other Ambulatory Visit: Payer: Self-pay | Admitting: Interventional Cardiology

## 2024-06-18 NOTE — Telephone Encounter (Signed)
 Spoke with pt and let him know that he has been approved for a grant for Jardiance and the information has already been sent to the pt's pharmacy. Pt verbalized understanding and was very appreciative of the help. No further questions or concerns at this time.

## 2024-06-18 NOTE — Telephone Encounter (Signed)
 Pt calling to state he contacted his pharmacy, and they do not have any information about the awarded grant. Please advise.

## 2024-06-19 ENCOUNTER — Ambulatory Visit: Payer: Self-pay | Admitting: Internal Medicine

## 2024-06-19 LAB — LIPID PANEL
Chol/HDL Ratio: 2.9 ratio (ref 0.0–5.0)
Cholesterol, Total: 99 mg/dL — ABNORMAL LOW (ref 100–199)
HDL: 34 mg/dL — ABNORMAL LOW (ref >39)
LDL Chol Calc (NIH): 52 mg/dL (ref 0–99)
Triglycerides: 53 mg/dL (ref 0–149)
VLDL Cholesterol Cal: 13 mg/dL (ref 5–40)

## 2024-06-19 LAB — HEPATIC FUNCTION PANEL
ALT: 11 IU/L (ref 0–44)
AST: 16 IU/L (ref 0–40)
Albumin: 4.2 g/dL (ref 3.8–4.8)
Alkaline Phosphatase: 59 IU/L (ref 47–123)
Bilirubin Total: 0.4 mg/dL (ref 0.0–1.2)
Bilirubin, Direct: 0.17 mg/dL (ref 0.00–0.40)
Total Protein: 6.8 g/dL (ref 6.0–8.5)

## 2024-06-19 LAB — LIPOPROTEIN A (LPA): Lipoprotein (a): 96.9 nmol/L — AB (ref <75.0)

## 2024-06-19 NOTE — Telephone Encounter (Signed)
 Pt c/o medication issue:  1. Name of Medication:   empagliflozin (JARDIANCE) 10 MG TABS tablet   2. How are you currently taking this medication (dosage and times per day)?   Not started yet  3. Are you having a reaction (difficulty breathing--STAT)?   4. What is your medication issue?    Patient called to follow-up on next steps to get his Jardiance medication and stated his prescription was not sent to [id number] - [place], [place] - [address].  Patient wants a call back to confirm medication has been sent.

## 2024-06-19 NOTE — Telephone Encounter (Signed)
 I called [facility] and gave them the grant info -now free  I called the patient and notified him

## 2024-07-07 ENCOUNTER — Ambulatory Visit: Attending: Cardiology

## 2024-07-07 VITALS — Ht 65.0 in | Wt 168.0 lb

## 2024-07-07 DIAGNOSIS — E119 Type 2 diabetes mellitus without complications: Secondary | ICD-10-CM

## 2024-07-07 NOTE — Patient Instructions (Addendum)
 Changes made by your pharmacist Efton Thomley E. Essie Gehret, PharmD at today's visit:    Instructions/Changes  (what do you need to do) Your Notes  (what you did and when you did it)  Continue metformin 1000 mg twice daily and Jardiance 10 mg daily   2.  Continue to work on diet and exercise,       limiting carbohydrate intake and eating      vegetables.   3. Please reach out to me if you reconsider      injectable weight loss therapy in the      future    Trai Ells E. Eisley Barber, Pharm.D [facility] [facility]. [facility] [address], [place], [place] [address] Phone: [phone]; Fax: [phone]

## 2024-07-07 NOTE — Progress Notes (Signed)
 Patient ID: Sean Pittman                 DOB: 31-Dec-1950                    MRN: [id number]     HPI: Sean Pittman is a 73 y.o. male patient referred to pharmacy clinic by Dr. Wendel to initiate GLP1-RA therapy. PMH is significant for T2D, cardiomyopathy, HTN, stage 3 CKD and obesity.   Patient presents to PharmD appointment in good spirits. While he recognizes there is still room for improvement in his weight, he reports weight loss over the years attributed to healthier eating habits. He also states that he is currently happy with his glucose management, noting elevated A1c in the past and dietary modifications over the years contributing to improved control.   He is currently taking metformin 1000 mg twice daily and Jardiance 10 mg daily for DM management. Bernadine was recently initiated by his cardiologist last month. He reports that he is doing well on the new medication and not experiencing any side effects. His most recent A1c 6.6% (03/2024). He monitors his FBG daily, typically reporting values in the 90-100 mg/dL range and denies any episodes of hypoglycemia.   Current weight and BMI: 168 lbs and 27.99 kg/m  Current meds that affect weight:Jardiance   After discussing GLP-1RA, including moa, dosing, potential side effects, administration, weight loss benefits and CV/renal protective effects, the patient expressed no interest in initiating any injectable therapy for DM or weight management at this time.   Diet:  Breakfast: fruit, egg Lunch/Dinner: typically skips lunch; eats vegetables: collard greens, fried okra, ham Snacks: potato chips, candy  Beverages: mostly water, 8 oz of soda every other day   Exercise: No structured exercise. He reports moving around a lot due to caring for his wife full time.  Family History:  Relation Problem Comments  Mother (Deceased) Diabetes   Hyperlipidemia   Hypertension   Kidney disease     Father (Deceased) Cancer     Sister Metallurgist)  Hypertension     Maternal Grandmother (Deceased)   Maternal Grandfather (Deceased)   Paternal Grandmother (Deceased)   Paternal Grandfather (Deceased)     Social History: Alcohol: none Smoking: none  Labs: Lab Results  Component Value Date   HGBA1C 12.3 (H) 10/11/2016    Wt Readings from Last 1 Encounters:  06/17/24 168 lb 3.2 oz (76.3 kg)    BP Readings from Last 1 Encounters:  06/17/24 120/65   Pulse Readings from Last 1 Encounters:  06/17/24 72       Component Value Date/Time   CHOL 99 (L) 06/17/2024 1041   TRIG 53 06/17/2024 1041   HDL 34 (L) 06/17/2024 1041   CHOLHDL 2.9 06/17/2024 1041   CHOLHDL 3.2 12/08/2011 1000   VLDL 9 12/08/2011 1000   LDLCALC 52 06/17/2024 1041    Past Medical History:  Diagnosis Date   Apical variant hypertrophic cardiomyopathy (HCC)    History of echocardiogram    Echo 11/16: Moderate LVH, vigorous LVF, EF 55-60%, normal wall motion, grade 1 diastolic dysfunction   Hypertension    S/P cardiac catheterization    LHC 4/13: no sig CAD, EF 60%.  LVEDP was 30 mmHg.  Evidence of apical hypertrophy.    Current Outpatient Medications on File Prior to Visit  Medication Sig Dispense Refill   amLODipine  (NORVASC ) 10 MG tablet TAKE 1 TABLET BY MOUTH EVERY DAY 90 tablet 3   aspirin  81 MG  tablet Take 81 mg by mouth daily.     atorvastatin  (LIPITOR) 20 MG tablet TAKE 1 TABLET BY MOUTH EVERY DAY 90 tablet 3   carvedilol  (COREG ) 12.5 MG tablet TAKE 1 TABLET BY MOUTH TWICE A DAY 180 tablet 3   diclofenac  Sodium (VOLTAREN ) 1 % GEL SMARTSIG:2 Gram(s) Topical Every 8 Hours PRN     empagliflozin (JARDIANCE) 10 MG TABS tablet Take 1 tablet (10 mg total) by mouth daily before breakfast. 30 tablet 3   metFORMIN (GLUCOPHAGE) 500 MG tablet Take 1,000 mg by mouth 2 (two) times daily with a meal.      telmisartan  (MICARDIS ) 80 MG tablet TAKE 1 TABLET BY MOUTH EVERY DAY 90 tablet 3   No current facility-administered medications on file prior to visit.     Allergies  Allergen Reactions   Levofloxacin  Diarrhea and Nausea And Vomiting   Penicillins Other (See Comments) and Rash    Childhood     Assessment/Plan:  1. Weight loss/T2DM -   Assessment:  Current BMI 27.99 indicates overweight  T2D is currently well controlled, with most recent A1c of 6.6% (03/2024) Tolerates current DM regimen well without any side effects Discussed next potential options (GLP1-RA); cost, dosing, efficacy, administration, side effects  Patient expresses no interest in initiating GLP-1 RA therapy at this time  Plan: Continue taking current medications for DM management: metformin 1000 mg twice daily and Jardiance 10 mg daily Encouraged patient to incorporate healthier dietary choices to support glycemic control and weight management Discussed and provided Planning Healthier Meals educational handout Advised patient to contact PharmD if he reconsiders initiating GLP-1 RA therapy in the future  Amaranta Mehl E. Jaclene Bartelt, Pharm.D [place] [facility]. [facility] [address], [place], [place] [address] Phone: [phone]; Fax: [phone]

## 2024-09-15 ENCOUNTER — Other Ambulatory Visit: Payer: Self-pay | Admitting: Internal Medicine
